# Patient Record
Sex: Female | Born: 1950
Health system: Southern US, Community
[De-identification: ages and names within clinical notes are randomized; demographics above are authoritative.]

## PROBLEM LIST (undated history)

## (undated) DIAGNOSIS — K449 Diaphragmatic hernia without obstruction or gangrene: Secondary | ICD-10-CM

## (undated) DIAGNOSIS — K589 Irritable bowel syndrome without diarrhea: Secondary | ICD-10-CM

## (undated) DIAGNOSIS — R011 Cardiac murmur, unspecified: Secondary | ICD-10-CM

## (undated) DIAGNOSIS — E785 Hyperlipidemia, unspecified: Secondary | ICD-10-CM

## (undated) DIAGNOSIS — N83209 Unspecified ovarian cyst, unspecified side: Secondary | ICD-10-CM

## (undated) DIAGNOSIS — I10 Essential (primary) hypertension: Secondary | ICD-10-CM

## (undated) HISTORY — PX: LAPAROSCOPY: SHX197

## (undated) HISTORY — DX: Essential (primary) hypertension: I10

## (undated) HISTORY — PX: DILATION AND CURETTAGE OF UTERUS: SHX78

## (undated) HISTORY — DX: Hyperlipidemia, unspecified: E78.5

---

## 1983-09-09 HISTORY — PX: TUBAL LIGATION: SHX77

## 1998-12-13 ENCOUNTER — Emergency Department (HOSPITAL_COMMUNITY): Admission: EM | Admit: 1998-12-13 | Discharge: 1998-12-13 | Payer: Self-pay | Admitting: Emergency Medicine

## 2000-07-22 ENCOUNTER — Inpatient Hospital Stay (HOSPITAL_COMMUNITY): Admission: AD | Admit: 2000-07-22 | Discharge: 2000-07-22 | Payer: Self-pay | Admitting: *Deleted

## 2002-01-28 ENCOUNTER — Encounter: Admission: RE | Admit: 2002-01-28 | Discharge: 2002-01-28 | Payer: Self-pay | Admitting: Emergency Medicine

## 2002-01-28 ENCOUNTER — Encounter: Payer: Self-pay | Admitting: Emergency Medicine

## 2003-06-09 ENCOUNTER — Emergency Department (HOSPITAL_COMMUNITY): Admission: EM | Admit: 2003-06-09 | Discharge: 2003-06-09 | Payer: Self-pay | Admitting: Emergency Medicine

## 2004-12-05 ENCOUNTER — Ambulatory Visit: Payer: Self-pay | Admitting: Family Medicine

## 2005-01-04 ENCOUNTER — Emergency Department (HOSPITAL_COMMUNITY): Admission: EM | Admit: 2005-01-04 | Discharge: 2005-01-05 | Payer: Self-pay | Admitting: Emergency Medicine

## 2005-05-15 ENCOUNTER — Emergency Department (HOSPITAL_COMMUNITY): Admission: EM | Admit: 2005-05-15 | Discharge: 2005-05-16 | Payer: Self-pay | Admitting: Emergency Medicine

## 2005-05-26 ENCOUNTER — Emergency Department (HOSPITAL_COMMUNITY): Admission: EM | Admit: 2005-05-26 | Discharge: 2005-05-27 | Payer: Self-pay | Admitting: *Deleted

## 2005-06-23 ENCOUNTER — Encounter: Admission: RE | Admit: 2005-06-23 | Discharge: 2005-06-23 | Payer: Self-pay | Admitting: Gastroenterology

## 2010-09-28 ENCOUNTER — Encounter: Payer: Self-pay | Admitting: Gastroenterology

## 2011-10-16 ENCOUNTER — Emergency Department (HOSPITAL_COMMUNITY): Payer: Self-pay

## 2011-10-16 ENCOUNTER — Emergency Department (HOSPITAL_COMMUNITY)
Admission: EM | Admit: 2011-10-16 | Discharge: 2011-10-17 | Disposition: A | Discharge status: Home / Self Care | Payer: Self-pay | Attending: Emergency Medicine | Admitting: Emergency Medicine

## 2011-10-16 ENCOUNTER — Encounter (HOSPITAL_COMMUNITY): Payer: Self-pay | Admitting: *Deleted

## 2011-10-16 DIAGNOSIS — F17200 Nicotine dependence, unspecified, uncomplicated: Secondary | ICD-10-CM | POA: Insufficient documentation

## 2011-10-16 DIAGNOSIS — R10819 Abdominal tenderness, unspecified site: Secondary | ICD-10-CM | POA: Insufficient documentation

## 2011-10-16 DIAGNOSIS — N949 Unspecified condition associated with female genital organs and menstrual cycle: Secondary | ICD-10-CM | POA: Insufficient documentation

## 2011-10-16 DIAGNOSIS — R350 Frequency of micturition: Secondary | ICD-10-CM | POA: Insufficient documentation

## 2011-10-16 DIAGNOSIS — N898 Other specified noninflammatory disorders of vagina: Secondary | ICD-10-CM | POA: Insufficient documentation

## 2011-10-16 DIAGNOSIS — N939 Abnormal uterine and vaginal bleeding, unspecified: Secondary | ICD-10-CM

## 2011-10-16 DIAGNOSIS — R109 Unspecified abdominal pain: Secondary | ICD-10-CM | POA: Insufficient documentation

## 2011-10-16 LAB — WET PREP, GENITAL: Yeast Wet Prep HPF POC: NONE SEEN

## 2011-10-16 LAB — URINALYSIS, ROUTINE W REFLEX MICROSCOPIC
Hgb urine dipstick: NEGATIVE
Specific Gravity, Urine: 1.014 (ref 1.005–1.030)
Urobilinogen, UA: 0.2 mg/dL (ref 0.0–1.0)
pH: 7 (ref 5.0–8.0)

## 2011-10-16 NOTE — ED Notes (Signed)
Pt states that she is post menopausal for 15-20 years and began having vaginal bleeding last night.  Pt also has been having some pain in her groin area and lower abdomen.  Pt states that the vaginal bleeding is lighter than her period used to be.

## 2011-10-16 NOTE — ED Provider Notes (Signed)
History     CSN: [id number]  Arrival date & time 10/16/11  1708   First MD Initiated Contact with Patient 10/16/11 1950      Chief Complaint  Patient presents with  . Vaginal Bleeding    (Consider location/radiation/quality/duration/timing/severity/associated sxs/prior treatment) Patient is a 61 y.o. female presenting with vaginal bleeding. The history is provided by the patient.  Vaginal Bleeding This is a new problem. The current episode started yesterday. The problem occurs constantly. The problem has been unchanged. Associated symptoms include abdominal pain. Pertinent negatives include no change in bowel habit, chills, fatigue, nausea, urinary symptoms or vomiting. The symptoms are aggravated by nothing. She has tried nothing for the symptoms.  Pt states she has not had a period in about 15 years. States has had lower abdominal cramping for last several days. States yesterday began having bleeding from vaginal canal. States bleeding similar to a period. Denies urinary symptoms. Denies rectal bleeding or blood in her urine. Denies fever, chills, nausea, vomiting.   History reviewed. No pertinent past medical history.  Past Surgical History  Procedure Date  . Tubal ligation     History reviewed. No pertinent family history.  History  Substance Use Topics  . Smoking status: Current Everyday Smoker -- 1.0 packs/day    Types: Cigarettes  . Smokeless tobacco: Not on file  . Alcohol Use: No    OB History    Grav Para Term Preterm Abortions TAB SAB Ect Mult Living                  Review of Systems  Constitutional: Negative for chills and fatigue.  HENT: Negative.   Eyes: Negative.   Respiratory: Negative.   Cardiovascular: Negative.   Gastrointestinal: Positive for abdominal pain. Negative for nausea, vomiting and change in bowel habit.  Genitourinary: Positive for frequency, vaginal bleeding and vaginal pain. Negative for dysuria, flank pain and difficulty urinating.    Musculoskeletal: Negative.   Skin: Negative.   Neurological: Negative.   Psychiatric/Behavioral: Negative.     Allergies  Penicillins  Home Medications   Current Outpatient Rx  Name Route Sig Dispense Refill  . ACETAMINOPHEN 500 MG PO TABS Oral Take 500 mg by mouth every 6 (six) hours as needed. For pain    . ASPIRIN 81 MG PO TABS Oral Take 81 mg by mouth daily.      BP 129/96  Pulse 114  Temp(Src) 100.4 F (38 C) (Oral)  Resp 22  SpO2 99%  Physical Exam  Nursing note and vitals reviewed. Constitutional: She is oriented to person, place, and time. She appears well-developed and well-nourished. No distress.  Eyes: Conjunctivae are normal.  Neck: Neck supple.  Cardiovascular: Normal rate, regular rhythm and normal heart sounds.   Pulmonary/Chest: Effort normal and breath sounds normal. No respiratory distress.  Abdominal: Soft. Bowel sounds are normal. She exhibits no distension. There is no rebound and no guarding.       Suprapubic, RLQ, LLQ tenderness  Genitourinary: Uterus is tender. Cervix exhibits motion tenderness. Right adnexum displays tenderness. Left adnexum displays tenderness. There is bleeding around the vagina.  Musculoskeletal: Normal range of motion.  Neurological: She is alert and oriented to person, place, and time.  Skin: Skin is warm.  Psychiatric: She has a normal mood and affect.    ED Course  Procedures (including critical care time)  Pt with post menopausal bleeding since last night. Moderate bleeding noted on pelvic exam. No clots. Uterine and bilateral adnexal tenderness. Will  get [place] to r/o fibroids/ endometritis.  Results for orders placed during the hospital encounter of 10/16/11  URINALYSIS, ROUTINE W REFLEX MICROSCOPIC      Component Value Range   Color, Urine YELLOW  YELLOW    APPearance CLEAR  CLEAR    Specific Gravity, Urine 1.014  1.005 - 1.030    pH 7.0  5.0 - 8.0    Glucose, UA NEGATIVE  NEGATIVE (mg/dL)   Hgb urine dipstick  NEGATIVE  NEGATIVE    Bilirubin Urine NEGATIVE  NEGATIVE    Ketones, ur NEGATIVE  NEGATIVE (mg/dL)   Protein, ur NEGATIVE  NEGATIVE (mg/dL)   Urobilinogen, UA 0.2  0.0 - 1.0 (mg/dL)   Nitrite NEGATIVE  NEGATIVE    Leukocytes, UA NEGATIVE  NEGATIVE   WET PREP, GENITAL      Component Value Range   Yeast Wet Prep HPF POC NONE SEEN  NONE SEEN    Trich, Wet Prep NONE SEEN  NONE SEEN    Clue Cells Wet Prep HPF POC FEW (*) NONE SEEN    WBC, Wet Prep HPF POC FEW (*) NONE SEEN    US Transvaginal Non-ob  10/16/2011  *RADIOLOGY REPORT*  Clinical Data: Bleeding`  TRANSABDOMINAL AND TRANSVAGINAL ULTRASOUND OF PELVIS Technique:  Both transabdominal and transvaginal ultrasound examinations of the pelvis were performed. Transabdominal technique was performed for global imaging of the pelvis including uterus, ovaries, adnexal regions, and pelvic cul-de-sac.  Comparison: None.   It was necessary to proceed with endovaginal exam following the transabdominal exam to better visualize the endometrium and ovaries.  Findings:  Uterus: 2.9 x 4.5 x 7.3 cm, unremarkable  Endometrium: 14 mm with a small amount of fluid in the endometrial cavity in the lower uterine segment.  Right ovary:  Not visualized  Left ovary: Not visualized  Other findings: No free fluid  IMPRESSION:  1.  Small amount of fluid in the endometrial cavity. 2.  Neither ovary is visualized on transabdominal or pelvic scanning.  Original Report Authenticated By: Osa Craver, M.D.   US Pelvis Complete  10/16/2011  *RADIOLOGY REPORT*  Clinical Data: Bleeding`  TRANSABDOMINAL AND TRANSVAGINAL ULTRASOUND OF PELVIS Technique:  Both transabdominal and transvaginal ultrasound examinations of the pelvis were performed. Transabdominal technique was performed for global imaging of the pelvis including uterus, ovaries, adnexal regions, and pelvic cul-de-sac.  Comparison: None.   It was necessary to proceed with endovaginal exam following the transabdominal exam  to better visualize the endometrium and ovaries.  Findings:  Uterus: 2.9 x 4.5 x 7.3 cm, unremarkable  Endometrium: 14 mm with a small amount of fluid in the endometrial cavity in the lower uterine segment.  Right ovary:  Not visualized  Left ovary: Not visualized  Other findings: No free fluid  IMPRESSION:  1.  Small amount of fluid in the endometrial cavity. 2.  Neither ovary is visualized on transabdominal or pelvic scanning.  Original Report Authenticated By: Osa Craver, M.D.    11:34 PM [place] negative. Ovaries not visualized. Pt's vs are normal, she is not tachycardic, not dizzy, bleeding just since yesterday, doubt H&H abnormal due to this bleeding. Did not check. Pt will need further evaluation and endometrial biopsy by GYN. Will refer for outpatient treatment.   No diagnosis found.    MDM          Lottie Mussel, PA 10/16/11 [phone]

## 2011-10-16 NOTE — ED Notes (Signed)
Pt returned from US

## 2011-10-16 NOTE — ED Notes (Signed)
Pt reports having menopause 20 years ago.  Started to have lower abdominal pain and groin pain x 4 days ago and bleeding that started yesterday.  Pt denies urinary symptoms.  Pt ambulatory.

## 2011-10-16 NOTE — ED Provider Notes (Signed)
Medical screening examination/treatment/procedure(s) were performed by non-physician practitioner and as supervising physician I was immediately available for consultation/collaboration. I have discussed the necessity for continued eval as an outpatient for this patient.  She was provided explicit instructions to facilitate this process  Gerhard Munch, MD 10/16/11 2345

## 2011-10-17 LAB — GC/CHLAMYDIA PROBE AMP, GENITAL
Chlamydia, DNA Probe: NEGATIVE
GC Probe Amp, Genital: NEGATIVE

## 2011-10-20 ENCOUNTER — Inpatient Hospital Stay (HOSPITAL_COMMUNITY)
Admission: AD | Admit: 2011-10-20 | Discharge: 2011-10-20 | Disposition: A | Discharge status: Home / Self Care | Payer: Self-pay | Source: Ambulatory Visit | Attending: Obstetrics & Gynecology | Admitting: Obstetrics & Gynecology

## 2011-10-20 ENCOUNTER — Encounter (HOSPITAL_COMMUNITY): Payer: Self-pay | Admitting: *Deleted

## 2011-10-20 DIAGNOSIS — N950 Postmenopausal bleeding: Secondary | ICD-10-CM

## 2011-10-20 HISTORY — DX: Diaphragmatic hernia without obstruction or gangrene: K44.9

## 2011-10-20 HISTORY — DX: Unspecified ovarian cyst, unspecified side: N83.209

## 2011-10-20 HISTORY — DX: Irritable bowel syndrome, unspecified: K58.9

## 2011-10-20 NOTE — ED Provider Notes (Signed)
61 y.o. [id number] seen in ED on 2/7 with postmenopausal bleeding. No bleeding or pain at this time. Had labs, pelvic and u/s at that visit. Was told to f/u in GYN clinic for EMB, came to [facility] by mistake tonight. No c/o now.   O:  Physical Exam  Nursing note and vitals reviewed. Constitutional: She is oriented to person, place, and time. She appears well-developed and well-nourished.  Cardiovascular: Normal rate.   Pulmonary/Chest: Effort normal.  Musculoskeletal: Normal range of motion.  Neurological: She is alert and oriented to person, place, and time.  Skin: Skin is warm and dry.  Psychiatric: She has a normal mood and affect.    US Transvaginal Non-ob  10/16/2011  *RADIOLOGY REPORT*  Clinical Data: Bleeding`  TRANSABDOMINAL AND TRANSVAGINAL ULTRASOUND OF PELVIS Technique:  Both transabdominal and transvaginal ultrasound examinations of the pelvis were performed. Transabdominal technique was performed for global imaging of the pelvis including uterus, ovaries, adnexal regions, and pelvic cul-de-sac.  Comparison: None.   It was necessary to proceed with endovaginal exam following the transabdominal exam to better visualize the endometrium and ovaries.  Findings:  Uterus: 2.9 x 4.5 x 7.3 cm, unremarkable  Endometrium: 14 mm with a small amount of fluid in the endometrial cavity in the lower uterine segment.  Right ovary:  Not visualized  Left ovary: Not visualized  Other findings: No free fluid  IMPRESSION:  1.  Small amount of fluid in the endometrial cavity. 2.  Neither ovary is visualized on transabdominal or pelvic scanning.  Original Report Authenticated By: Osa Craver, M.D.   US Pelvis Complete  10/16/2011  *RADIOLOGY REPORT*  Clinical Data: Bleeding`  TRANSABDOMINAL AND TRANSVAGINAL ULTRASOUND OF PELVIS Technique:  Both transabdominal and transvaginal ultrasound examinations of the pelvis were performed. Transabdominal technique was performed for global imaging of the pelvis including  uterus, ovaries, adnexal regions, and pelvic cul-de-sac.  Comparison: None.   It was necessary to proceed with endovaginal exam following the transabdominal exam to better visualize the endometrium and ovaries.  Findings:  Uterus: 2.9 x 4.5 x 7.3 cm, unremarkable  Endometrium: 14 mm with a small amount of fluid in the endometrial cavity in the lower uterine segment.  Right ovary:  Not visualized  Left ovary: Not visualized  Other findings: No free fluid  IMPRESSION:  1.  Small amount of fluid in the endometrial cavity. 2.  Neither ovary is visualized on transabdominal or pelvic scanning.  Original Report Authenticated By: Osa Craver, M.D.   A/P: 61 y.o. [id number] with postmenopausal bleeding Note sent to GYN clinic to scheduled f/u appt Rev'd precautions

## 2011-10-20 NOTE — Progress Notes (Signed)
Other day had some bleeding, vag.  Went to [facility], had [place] and 'pap'.  Was told to come here, that she needed a biopsy.  Is no longer having bleeding.  Had not had a period in over 22yrs.

## 2011-11-26 ENCOUNTER — Ambulatory Visit (INDEPENDENT_AMBULATORY_CARE_PROVIDER_SITE_OTHER): Payer: Self-pay | Admitting: Obstetrics & Gynecology

## 2011-11-26 ENCOUNTER — Encounter: Payer: Self-pay | Admitting: Obstetrics & Gynecology

## 2011-11-26 VITALS — BP 121/86 | HR 84 | Temp 97.1°F | Resp 20 | Ht 63.0 in | Wt 196.3 lb

## 2011-11-26 DIAGNOSIS — Z01419 Encounter for gynecological examination (general) (routine) without abnormal findings: Secondary | ICD-10-CM

## 2011-11-26 DIAGNOSIS — N950 Postmenopausal bleeding: Secondary | ICD-10-CM

## 2011-11-26 NOTE — Progress Notes (Signed)
History:  61 y.o. [id number] here today for fevaluation of postmenopausal bleeding.  She had one episode of bleeding on 10/20/11 and none since then.  She was had an ultrasound on 10/16/11, see report below.    10/16/11 TRANSABDOMINAL AND TRANSVAGINAL ULTRASOUND OF PELVIS  Findings: Uterus: 2.9 x 4.5 x 7.3 cm, unremarkable.  Endometrium: 14 mm with a small amount of fluid in the endometrial cavity in the lower uterine segment. Right ovary: Not visualized. Left ovary: Not visualized.  Other findings: No free fluid.  IMPRESSION: 1. Small amount of fluid in the endometrial cavity. 2. Neither ovary is visualized on transabdominal or pelvic scanning.   The following portions of the patient's history were reviewed and updated as appropriate: allergies, current medications, past family history, past medical history, past social history, past surgical history and problem list.   Objective:  Physical Exam Blood pressure 121/86, pulse 84, temperature 97.1 F (36.2 C), temperature source Oral, resp. rate 20, height 5\' 3"  (1.6 m), weight 196 lb 4.8 oz (89.041 kg). Gen: NAD Abd: Soft, nontender and nondistended Pelvic: Atrophic external genitalia, used Pedersen speculum with some difficulty; atrophic vaginal mucosa and cervix.  No discharge seen.  Cervix noted to be very friable and had significant bleeding when pap smear spatula was used, unable to pass endocervical brush/pipelle into cervix due to stenosis.  Unable to palpate uterus/adnexa secondary to habitus.  Assessment & Plan:  Postmenopausal bleeding, thickened endometrial stripe, cervical stenosis, bleeding on pap.  Pap likely inadequate secondary to cervical stenosis.    Given the concern about possible neoplasia, patient was counseled about needing hysteroscopy, D&C, possible repeat pap smear.  The risks of surgery were discussed in detail with the patient including but not limited to: bleeding; infection; injury to uterus or surrounding organs; need for  additional procedures including laparoscopy; and other postoperative or anesthesia complications.  All questions were answered.  She was told that she will be contacted by our surgical scheduler regarding the time and date of her surgery; routine preoperative instructions of having nothing to eat or drink after midnight on the day prior to surgery and also coming to the hospital 1 1/2 hours prior to her time of surgery were also emphasized.  She was told she may be called for a preoperative appointment about a week prior to surgery and will be given further preoperative instructions at that visit. Bleeding precautions reviewed.  Written information about the proposed procedures was given to the patient to review at home.

## 2011-11-26 NOTE — Patient Instructions (Signed)
Dilation and Curettage or Vacuum Curettage Dilation and curettage (D&C) and vacuum curettage are minor procedures. A D&C involves stretching (dilation) the cervix and scraping (curettage) the inside lining of the womb (uterus). During a D&C, tissue is gently scraped from the inside lining of the uterus. During a vacuum curettage, the lining and tissue in the uterus are removed with the use of gentle suction. Curettage may be performed for diagnostic or therapeutic purposes. As a diagnostic procedure, curettage is performed for the purpose of examining tissues from the uterus. Tissue examination may help determine causes or treatment options for symptoms. A diagnostic curettage may be performed for the following symptoms:  Irregular bleeding in the uterus.   Bleeding with the development of clots.   Spotting between menstrual periods.   Prolonged menstrual periods.   Bleeding after menopause.   No menstrual period (amenorrhea).   A change in size and shape of the uterus.  A therapeutic curettage is performed to remove tissue, blood, or a contraceptive device. Therapeutic curettage may be performed for the following conditions:   Removal of polyps inside the uterus.   Removal of uncommon types of fibroids (noncancerous lumps).  LET YOUR CAREGIVER KNOW ABOUT:   Allergies to food or medicine.   Medicines taken, including vitamins, herbs, eyedrops, over-the-counter medicines, and creams.   Use of steroids (by mouth or creams).   Previous problems with anesthetics or numbing medicines.   History of bleeding problems or blood clots.   Previous surgery.   Other health problems, including diabetes and kidney problems.   Possibility of pregnancy, if this applies.  RISKS AND COMPLICATIONS   Excessive bleeding.   Infection of the uterus.   Damage to the cervix.   Development of scar tissue (adhesions) inside the uterus, later causing abnormal amounts of menstrual bleeding.    Complications from the general anesthetic, if a general anesthetic is used.   Putting a hole (perforation) in the uterus. This is rare.  BEFORE THE PROCEDURE   Eat and drink before the procedure only as directed by your caregiver.   Arrange for someone to take you home.  PROCEDURE   This procedure may be done in a hospital, outpatient clinic, or caregiver's office.   You may be given a general anesthetic or a local anesthetic in and around the cervix.   You will lie on your back with your legs in stirrups.   There are two ways in which your cervix can be softened and dilated. These include:   Taking a medicine.   Having thin rods (laminaria) inserted into your cervix.   A curved tool (curette) will scrape cells from the inside lining of the uterus and will then be removed.  This procedure usually takes about 15 to 30 minutes. AFTER THE PROCEDURE   You will rest in the recovery area until you are stable and are ready to go home.   You will need to have someone take you home.   You may feel sick to your stomach (nauseous) or throw up (vomit) if you had general anesthesia.   You may have a sore throat if a tube was placed in your throat during general anesthesia.   You may have light cramping and bleeding for 2 days to 2 weeks after the procedure.   Your uterus needs to make a new lining after the procedure. This may make your next period late.  Document Released: 08/25/2005 Document Revised: 08/14/2011 Document Reviewed: 03/23/2009 [facility] Patient Information 2012 [place], [place].  Hysteroscopy Hysteroscopy is a procedure used for looking inside the womb (uterus). It may be done for many different reasons, including:  To evaluate abnormal bleeding, fibroid (benign, noncancerous) tumors, polyps, scar tissue (adhesions), and possibly cancer of the uterus.   To look for lumps (tumors) and other uterine growths.   To look for causes of why a woman cannot get pregnant  (infertility), causes of recurrent loss of pregnancy (miscarriages), or a lost intrauterine device (IUD).   To perform a sterilization by blocking the fallopian tubes from inside the uterus.  A hysteroscopy should be done right after a menstrual period to be sure you are not pregnant. LET YOUR CAREGIVER KNOW ABOUT:   Allergies.   Medicines taken, including herbs, eyedrops, over-the-counter medicines, and creams.   Use of steroids (by mouth or creams).   Previous problems with anesthetics or numbing medicines.   History of bleeding or blood problems.   History of blood clots.   Possibility of pregnancy, if this applies.   Previous surgery.   Other health problems.  RISKS AND COMPLICATIONS   Putting a hole in the uterus.   Excessive bleeding.   Infection.   Damage to the cervix.   Injury to other organs.   Allergic reaction to medicines.   Too much fluid used in the uterus for the procedure.  BEFORE THE PROCEDURE   Do not take aspirin or blood thinners for a week before the procedure, or as directed. It can cause bleeding.   Arrive at least 90 minutes before the procedure or as directed to read and sign the necessary forms.   Arrange for someone to take you home after the procedure.   If you smoke, do not smoke for 2 weeks before the procedure.  PROCEDURE   Your caregiver may give you medicine to relax you. He or she may also give you a medicine that numbs the area around the cervix (local anesthetic) or a medicine that makes you sleep (general anesthesia).   Sometimes, a medicine is placed in the cervix the day before the procedure. This medicine makes the cervix have a larger opening (dilate). This makes it easier for the instrument to be inserted into the uterus.   A small instrument (hysteroscope) is inserted through the vagina into the uterus. This instrument is similar to a pencil-sized telescope with a light.   During the procedure, air or a liquid is put  into the uterus, which allows the surgeon to see better.   Sometimes, tissue is gently scraped from inside the uterus. These tissue samples are sent to a specialist who looks at tissue samples (pathologist). The pathologist will give a report to your caregiver. This will help your caregiver decide if further treatment is necessary. The report will also help your caregiver decide on the best treatment if the test comes back abnormal.  AFTER THE PROCEDURE   If you had a general anesthetic, you may be groggy for a couple hours after the procedure.   If you had a local anesthetic, you will be advised to rest at the surgical center or caregiver's office until you are stable and feel ready to go home.   You may have some cramping for a couple days.   You may have bleeding, which varies from light spotting for a few days to menstrual-like bleeding for up to 3 to 7 days. This is normal.   Have someone take you home.  FINDING OUT THE RESULTS OF YOUR TEST Not all test  results are available during your visit. If your test results are not back during the visit, make an appointment with your caregiver to find out the results. Do not assume everything is normal if you have not heard from your caregiver or the medical facility. It is important for you to follow up on all of your test results. HOME CARE INSTRUCTIONS   Do not drive for 24 hours or as instructed.   Only take over-the-counter or prescription medicines for pain, discomfort, or fever as directed by your caregiver.   Do not take aspirin. It can cause or aggravate bleeding.   Do not drive or drink alcohol while taking pain medicine.   You may resume your usual diet.   Do not use tampons, douche, or have sexual intercourse for 2 weeks, or as advised by your caregiver.   Rest and sleep for the first 24 to 48 hours.   Take your temperature twice a day for 4 to 5 days. Write it down. Give these temperatures to your caregiver if they are  abnormal (above 98.6 F or 37.0 C).   Take medicines your caregiver has ordered as directed.   Follow your caregiver's advice regarding diet, exercise, lifting, driving, and general activities.   Take showers instead of baths for 2 weeks, or as recommended by your caregiver.   If you develop constipation:   Take a mild laxative with the advice of your caregiver.   Eat bran foods.   Drink enough water and fluids to keep your urine clear or pale yellow.   Try to have someone with you or available to you for the first 24 to 48 hours, especially if you had a general anesthetic.   Make sure you and your family understand everything about your operation and recovery.   Follow your caregiver's advice regarding follow-up appointments and Pap smears.  SEEK MEDICAL CARE IF:   You feel dizzy or lightheaded.   You feel sick to your stomach (nauseous).   You develop abnormal vaginal discharge.   You develop a rash.   You have an abnormal reaction or allergy to your medicine.   You need stronger pain medicine.  SEEK IMMEDIATE MEDICAL CARE IF:   Bleeding is heavier than a normal menstrual period or you have blood clots.   You have an oral temperature above 102 F (38.9 C), not controlled by medicine.   You have increasing cramps or pains not relieved with medicine.   You develop belly (abdominal) pain that does not seem to be related to the same area of earlier cramping and pain.   You pass out.   You develop pain in the tops of your shoulders (shoulder strap areas).   You develop shortness of breath.  MAKE SURE YOU:   Understand these instructions.   Will watch your condition.   Will get help right away if you are not doing well or get worse.  Document Released: 12/01/2000 Document Revised: 08/14/2011 Document Reviewed: 03/26/2009 [facility] Patient Information 2012 [place], [place].

## 2011-11-28 ENCOUNTER — Encounter (HOSPITAL_COMMUNITY): Payer: Self-pay | Admitting: Pharmacist

## 2011-11-28 ENCOUNTER — Other Ambulatory Visit: Payer: Self-pay | Admitting: Obstetrics & Gynecology

## 2011-11-28 DIAGNOSIS — Z1231 Encounter for screening mammogram for malignant neoplasm of breast: Secondary | ICD-10-CM

## 2011-12-08 ENCOUNTER — Encounter (HOSPITAL_COMMUNITY): Payer: Self-pay

## 2011-12-08 ENCOUNTER — Other Ambulatory Visit: Payer: Self-pay

## 2011-12-08 ENCOUNTER — Encounter (HOSPITAL_COMMUNITY)
Admission: RE | Admit: 2011-12-08 | Discharge: 2011-12-08 | Disposition: A | Discharge status: Home / Self Care | Payer: Self-pay | Source: Ambulatory Visit | Attending: Obstetrics & Gynecology | Admitting: Obstetrics & Gynecology

## 2011-12-08 HISTORY — DX: Cardiac murmur, unspecified: R01.1

## 2011-12-08 LAB — CBC
HCT: 40.6 % (ref 36.0–46.0)
Hemoglobin: 13.2 g/dL (ref 12.0–15.0)
MCH: 30.3 pg (ref 26.0–34.0)
MCHC: 32.5 g/dL (ref 30.0–36.0)
RDW: 14.4 % (ref 11.5–15.5)

## 2011-12-08 NOTE — Patient Instructions (Addendum)
   Your procedure is scheduled on: Wednesday April 10th  Enter through the [employer] of [facility] at:9:30am Pick up the phone at the desk and dial [phone] and inform us of your arrival.  Please call this number if you have any problems the morning of surgery: [phone]  Remember: Do not eat food after midnight: Tuesday Do not drink clear liquids after: midnight Tuesday Take these medicines the morning of surgery with a SIP OF WATER: none  Do not wear jewelry, make-up, or FINGER nail polish Do not wear lotions, powders, perfumes or deodorant. Do not shave 48 hours prior to surgery. Do not bring valuables to the hospital. Contacts, dentures or bridgework may not be worn into surgery.    Patients discharged on the day of surgery will not be allowed to drive home.     Remember to use your hibiclens as instructed.Please shower with 1/2 bottle the evening before your surgery and the other 1/2 bottle the morning of surgery. Neck down avoiding private area.

## 2011-12-17 ENCOUNTER — Ambulatory Visit (HOSPITAL_COMMUNITY)
Admission: RE | Admit: 2011-12-17 | Discharge: 2011-12-17 | Disposition: A | Discharge status: Home / Self Care | Payer: Self-pay | Source: Ambulatory Visit | Attending: Obstetrics & Gynecology | Admitting: Obstetrics & Gynecology

## 2011-12-17 ENCOUNTER — Encounter (HOSPITAL_COMMUNITY): Payer: Self-pay | Admitting: Anesthesiology

## 2011-12-17 ENCOUNTER — Encounter (HOSPITAL_COMMUNITY): Payer: Self-pay | Admitting: Obstetrics & Gynecology

## 2011-12-17 ENCOUNTER — Ambulatory Visit (HOSPITAL_COMMUNITY): Payer: Self-pay | Admitting: Anesthesiology

## 2011-12-17 ENCOUNTER — Encounter (HOSPITAL_COMMUNITY): Payer: Self-pay | Admitting: *Deleted

## 2011-12-17 ENCOUNTER — Encounter (HOSPITAL_COMMUNITY)
Admission: RE | Disposition: A | Discharge status: Home / Self Care | Payer: Self-pay | Source: Ambulatory Visit | Attending: Obstetrics & Gynecology

## 2011-12-17 DIAGNOSIS — Z01818 Encounter for other preprocedural examination: Secondary | ICD-10-CM | POA: Insufficient documentation

## 2011-12-17 DIAGNOSIS — Z01812 Encounter for preprocedural laboratory examination: Secondary | ICD-10-CM | POA: Insufficient documentation

## 2011-12-17 DIAGNOSIS — N950 Postmenopausal bleeding: Secondary | ICD-10-CM

## 2011-12-17 DIAGNOSIS — N882 Stricture and stenosis of cervix uteri: Secondary | ICD-10-CM

## 2011-12-17 HISTORY — PX: HYSTEROSCOPY WITH D & C: SHX1775

## 2011-12-17 SURGERY — DILATATION AND CURETTAGE /HYSTEROSCOPY
Anesthesia: General | Site: Vagina

## 2011-12-17 MED ORDER — KETOROLAC TROMETHAMINE 60 MG/2ML IM SOLN
INTRAMUSCULAR | Status: DC | PRN
  Administered 2011-12-17: 30 mg via INTRAMUSCULAR

## 2011-12-17 MED ORDER — KETOROLAC TROMETHAMINE 30 MG/ML IJ SOLN
INTRAMUSCULAR | Status: DC | PRN
  Administered 2011-12-17: 30 mg via INTRAVENOUS

## 2011-12-17 MED ORDER — GLYCOPYRROLATE 0.2 MG/ML IJ SOLN
INTRAMUSCULAR | Status: AC
  Filled 2011-12-17: qty 1

## 2011-12-17 MED ORDER — BUPIVACAINE HCL (PF) 0.25 % IJ SOLN
INTRAMUSCULAR | Status: DC | PRN
  Administered 2011-12-17: 30 mL

## 2011-12-17 MED ORDER — LACTATED RINGERS IV SOLN
INTRAVENOUS | Status: DC
  Administered 2011-12-17: 100 mL/h via INTRAVENOUS
  Administered 2011-12-17: 12:00:00 via INTRAVENOUS

## 2011-12-17 MED ORDER — DOCUSATE SODIUM 100 MG PO CAPS: 100.0000 mg | ORAL_CAPSULE | Freq: Two times a day (BID) | ORAL | Status: AC | PRN

## 2011-12-17 MED ORDER — KETOROLAC TROMETHAMINE 60 MG/2ML IM SOLN
INTRAMUSCULAR | Status: AC
  Filled 2011-12-17: qty 2

## 2011-12-17 MED ORDER — BUPIVACAINE HCL (PF) 0.25 % IJ SOLN
INTRAMUSCULAR | Status: AC
  Filled 2011-12-17: qty 30

## 2011-12-17 MED ORDER — FENTANYL CITRATE 0.05 MG/ML IJ SOLN: 25.0000 ug | INTRAMUSCULAR | Status: DC | PRN

## 2011-12-17 MED ORDER — FENTANYL CITRATE 0.05 MG/ML IJ SOLN
INTRAMUSCULAR | Status: AC
  Filled 2011-12-17: qty 2

## 2011-12-17 MED ORDER — MIDAZOLAM HCL 5 MG/5ML IJ SOLN
INTRAMUSCULAR | Status: DC | PRN
  Administered 2011-12-17: 2 mg via INTRAVENOUS

## 2011-12-17 MED ORDER — IBUPROFEN 200 MG PO TABS: 600.0000 mg | ORAL_TABLET | Freq: Four times a day (QID) | ORAL | Status: DC | PRN

## 2011-12-17 MED ORDER — LIDOCAINE HCL (CARDIAC) 20 MG/ML IV SOLN
INTRAVENOUS | Status: DC | PRN
  Administered 2011-12-17: 60 mg via INTRAVENOUS

## 2011-12-17 MED ORDER — ONDANSETRON HCL 4 MG/2ML IJ SOLN
INTRAMUSCULAR | Status: AC
  Filled 2011-12-17: qty 2

## 2011-12-17 MED ORDER — BUPIVACAINE-EPINEPHRINE (PF) 0.5% -1:200000 IJ SOLN
INTRAMUSCULAR | Status: AC
  Filled 2011-12-17: qty 10

## 2011-12-17 MED ORDER — KETOROLAC TROMETHAMINE 30 MG/ML IJ SOLN: 15.0000 mg | Freq: Once | INTRAMUSCULAR | Status: DC | PRN

## 2011-12-17 MED ORDER — MIDAZOLAM HCL 2 MG/2ML IJ SOLN
INTRAMUSCULAR | Status: AC
  Filled 2011-12-17: qty 2

## 2011-12-17 MED ORDER — OXYCODONE-ACETAMINOPHEN 5-325 MG PO TABS: 1.0000 | ORAL_TABLET | Freq: Four times a day (QID) | ORAL | Status: AC | PRN

## 2011-12-17 MED ORDER — PROPOFOL 10 MG/ML IV EMUL
INTRAVENOUS | Status: DC | PRN
  Administered 2011-12-17: 150 mg via INTRAVENOUS

## 2011-12-17 MED ORDER — DEXAMETHASONE SODIUM PHOSPHATE 10 MG/ML IJ SOLN
INTRAMUSCULAR | Status: AC
  Filled 2011-12-17: qty 1

## 2011-12-17 MED ORDER — FENTANYL CITRATE 0.05 MG/ML IJ SOLN
INTRAMUSCULAR | Status: DC | PRN
  Administered 2011-12-17 (×2): 50 ug via INTRAVENOUS

## 2011-12-17 MED ORDER — PROPOFOL 10 MG/ML IV EMUL
INTRAVENOUS | Status: AC
  Filled 2011-12-17: qty 20

## 2011-12-17 MED ORDER — LIDOCAINE HCL (CARDIAC) 20 MG/ML IV SOLN
INTRAVENOUS | Status: AC
  Filled 2011-12-17: qty 5

## 2011-12-17 SURGICAL SUPPLY — 15 items
CANISTER SUCTION 2500CC (MISCELLANEOUS) ×2 IMPLANT
CATH ROBINSON RED A/P 16FR (CATHETERS) ×2 IMPLANT
CLOTH BEACON ORANGE TIMEOUT ST (SAFETY) ×2 IMPLANT
CONTAINER PREFILL 10% NBF 60ML (FORM) ×4 IMPLANT
DILATOR CANAL MILEX (MISCELLANEOUS) ×2 IMPLANT
DRAPE PROXIMA HALF (DRAPES) ×2 IMPLANT
ELECT LOOP GYNE PRO 24FR (CUTTING LOOP)
ELECTRODE LOOP GYNE PRO 24FR (CUTTING LOOP) IMPLANT
GLOVE BIO SURGEON STRL SZ7 (GLOVE) ×2 IMPLANT
GOWN PREVENTION PLUS LG XLONG (DISPOSABLE) ×4 IMPLANT
GOWN STRL REIN XL XLG (GOWN DISPOSABLE) ×2 IMPLANT
LOOP ANGLED CUTTING 22FR (CUTTING LOOP) IMPLANT
PACK HYSTEROSCOPY LF (CUSTOM PROCEDURE TRAY) ×2 IMPLANT
TOWEL OR 17X24 6PK STRL BLUE (TOWEL DISPOSABLE) ×4 IMPLANT
WATER STERILE IRR 1000ML POUR (IV SOLUTION) ×2 IMPLANT

## 2011-12-17 NOTE — Anesthesia Preprocedure Evaluation (Addendum)
Anesthesia Evaluation  Patient identified by MRN, date of birth, ID band Patient awake    Reviewed: Allergy & Precautions, H&P , NPO status , Patient's Chart, lab work & pertinent test results, reviewed documented beta blocker date and time   History of Anesthesia Complications Negative for: history of anesthetic complications  Airway Mallampati: I TM Distance: >3 FB Neck ROM: full    Dental  (+) Poor Dentition,  Many missing or broken teeth, reports none are loose:   Pulmonary Current Smoker (1ppd),  breath sounds clear to auscultation  Pulmonary exam normal       Cardiovascular + Valvular Problems/Murmurs (has been told she had a heart murmur, I did not hear it) Rhythm:regular Rate:Normal  EKG from 12/08/11 is NSR, no evidence of ischemia   Neuro/Psych Patient reports episode 9-10 years ago where she was unable to speak. Evaluated at [facility], denies that she had stroke or TIA. negative psych ROS   GI/Hepatic Neg liver ROS, hiatal hernia (no meds),   Endo/Other  negative endocrine ROS  Renal/GU negative Renal ROS  negative genitourinary   Musculoskeletal negative musculoskeletal ROS (+)   Abdominal   Peds negative pediatric ROS (+)  Hematology negative hematology ROS (+)   Anesthesia Other Findings Poor historian.  Discussed risk of anesthesia related to smoking.  Reproductive/Obstetrics negative OB ROS                          Anesthesia Physical Anesthesia Plan  ASA: III  Anesthesia Plan: General LMA   Post-op Pain Management:    Induction:   Airway Management Planned:   Additional Equipment:   Intra-op Plan:   Post-operative Plan:   Informed Consent: I have reviewed the patients History and Physical, chart, labs and discussed the procedure including the risks, benefits and alternatives for the proposed anesthesia with the patient or authorized representative who has indicated  his/her understanding and acceptance.   Dental Advisory Given  Plan Discussed with: CRNA and Surgeon  Anesthesia Plan Comments:        Anesthesia Quick Evaluation

## 2011-12-17 NOTE — Anesthesia Procedure Notes (Signed)
Procedure Name: LMA Insertion Date/Time: 12/17/2011 11:59 AM Performed by: Graciela Husbands Pre-anesthesia Checklist: Patient identified, Timeout performed, Emergency Drugs available, Suction available and Patient being monitored Patient Re-evaluated:Patient Re-evaluated prior to inductionOxygen Delivery Method: Circle system utilized Preoxygenation: Pre-oxygenation with 100% oxygen Intubation Type: IV induction LMA: LMA inserted LMA Size: 4.0 Number of attempts: 1 Placement Confirmation: breath sounds checked- equal and bilateral and positive ETCO2 Dental Injury: Teeth and Oropharynx as per pre-operative assessment

## 2011-12-17 NOTE — H&P (View-Only) (Signed)
History:  61 y.o. Krista Jimenez here today for fevaluation of postmenopausal bleeding.  She had one episode of bleeding on 10/20/11 and none since then.  She was had an ultrasound on 10/16/11, see report below.    10/16/11 TRANSABDOMINAL AND TRANSVAGINAL ULTRASOUND OF PELVIS  Findings: Uterus: 2.9 x 4.5 x 7.3 cm, unremarkable.  Endometrium: 14 mm with a small amount of fluid in the endometrial cavity in the lower uterine segment. Right ovary: Not visualized. Left ovary: Not visualized.  Other findings: No free fluid.  IMPRESSION: 1. Small amount of fluid in the endometrial cavity. 2. Neither ovary is visualized on transabdominal or pelvic scanning.   The following portions of the patient's history were reviewed and updated as appropriate: allergies, current medications, past family history, past medical history, past social history, past surgical history and problem list.   Objective:  Physical Exam Blood pressure 121/86, pulse 84, temperature 97.1 F (36.2 C), temperature source Oral, resp. rate 20, height 5\' 3"  (1.6 m), weight 196 lb 4.8 oz (89.041 kg). Gen: NAD Abd: Soft, nontender and nondistended Pelvic: Atrophic external genitalia, used Pedersen speculum with some difficulty; atrophic vaginal mucosa and cervix.  No discharge seen.  Cervix noted to be very friable and had significant bleeding when pap smear spatula was used, unable to pass endocervical brush/pipelle into cervix due to stenosis.  Unable to palpate uterus/adnexa secondary to habitus.  Assessment & Plan:  Postmenopausal bleeding, thickened endometrial stripe, cervical stenosis, bleeding on pap.  Pap likely inadequate secondary to cervical stenosis.    Given the concern about possible neoplasia, patient was counseled about needing hysteroscopy, D&C, possible repeat pap smear.  The risks of surgery were discussed in detail with the patient including but not limited to: bleeding; infection; injury to uterus or surrounding organs; need for  additional procedures including laparoscopy; and other postoperative or anesthesia complications.  All questions were answered.  She was told that she will be contacted by our surgical scheduler regarding the time and date of her surgery; routine preoperative instructions of having nothing to eat or drink after midnight on the day prior to surgery and also coming to the hospital 1 1/2 hours prior to her time of surgery were also emphasized.  She was told she may be called for a preoperative appointment about a week prior to surgery and will be given further preoperative instructions at that visit. Bleeding precautions reviewed.  Written information about the proposed procedures was given to the patient to review at home.

## 2011-12-17 NOTE — Transfer of Care (Signed)
Immediate Anesthesia Transfer of Care Note  Patient: Krista Jimenez  Procedure(s) Performed: Procedure(s) (LRB): DILATATION AND CURETTAGE /HYSTEROSCOPY (N/A)  Patient Location: PACU  Anesthesia Type: General  Level of Consciousness: awake, alert  and oriented  Airway & Oxygen Therapy: Patient Spontanous Breathing and Patient connected to nasal cannula oxygen  Post-op Assessment: Report given to PACU RN and Post -op Vital signs reviewed and stable  Post vital signs: Reviewed and stable  Complications: No apparent anesthesia complications

## 2011-12-17 NOTE — Interval H&P Note (Signed)
History and Physical Interval Note: 12/17/2011 11:12 AM  Krista Jimenez  has presented today for surgery, with the diagnosis of Postmenopausal bleeding  The various methods of treatment have been discussed with the patient and family.  Questions were answered to the patient's satisfaction.  After consideration of risks, benefits and other options for treatment, the patient has consented to  Procedures: HYSTEROSCOPY,  DILATATION AND CURETTAGE as surgical intervention.  The patients' history has been reviewed, patient examined, no change in status, stable for surgery.  I have reviewed the patients' chart and labs; she had a normal pap smear in clinic which was satisfactory.  To OR when ready.  [name], M.D. 12/17/2011 11:14 AM

## 2011-12-17 NOTE — Discharge Instructions (Signed)
Hysteroscopy Hysteroscopy is a procedure used for looking inside the womb (uterus). It may be done for many different reasons, including:  To evaluate abnormal bleeding, fibroid (benign, noncancerous) tumors, polyps, scar tissue (adhesions), and possibly cancer of the uterus.   To look for lumps (tumors) and other uterine growths.   To look for causes of why a woman cannot get pregnant (infertility), causes of recurrent loss of pregnancy (miscarriages), or a lost intrauterine device (IUD).   To perform a sterilization by blocking the fallopian tubes from inside the uterus.  A hysteroscopy should be done right after a menstrual period to be sure you are not pregnant. LET YOUR CAREGIVER KNOW ABOUT:   Allergies.   Medicines taken, including herbs, eyedrops, over-the-counter medicines, and creams.   Use of steroids (by mouth or creams).   Previous problems with anesthetics or numbing medicines.   History of bleeding or blood problems.   History of blood clots.   Possibility of pregnancy, if this applies.   Previous surgery.   Other health problems.  RISKS AND COMPLICATIONS   Putting a hole in the uterus.   Excessive bleeding.   Infection.   Damage to the cervix.   Injury to other organs.   Allergic reaction to medicines.   Too much fluid used in the uterus for the procedure.  PROCEDURE   Your caregiver may give you medicine to relax you. He or she may also give you a medicine that numbs the area around the cervix (local anesthetic) or a medicine that makes you sleep (general anesthesia).   Sometimes, a medicine is placed in the cervix the day before the procedure. This medicine makes the cervix have a larger opening (dilate). This makes it easier for the instrument to be inserted into the uterus.   A small instrument (hysteroscope) is inserted through the vagina into the uterus. This instrument is similar to a pencil-sized telescope with a light.   During the  procedure, air or a liquid is put into the uterus, which allows the surgeon to see better.   Sometimes, tissue is gently scraped from inside the uterus. These tissue samples are sent to a specialist who looks at tissue samples (pathologist). The pathologist will give a report to your caregiver. This will help your caregiver decide if further treatment is necessary. The report will also help your caregiver decide on the best treatment if the test comes back abnormal.  AFTER THE PROCEDURE   If you had a general anesthetic, you may be groggy for a couple hours after the procedure.   If you had a local anesthetic, you will be advised to rest at the surgical center or caregiver's office until you are stable and feel ready to go home.   You may have some cramping for a couple days.   You may have bleeding, which varies from light spotting for a few days to menstrual-like bleeding for up to 3 to 7 days. This is normal.   Have someone take you home.  FINDING OUT THE RESULTS OF YOUR TEST Not all test results are available during your visit. If your test results are not back during the visit, make an appointment with your caregiver to find out the results. Do not assume everything is normal if you have not heard from your caregiver or the medical facility. It is important for you to follow up on all of your test results. HOME CARE INSTRUCTIONS   Do not drive for 24 hours or as instructed.  Only take over-the-counter or prescription medicines for pain, discomfort, or fever as directed by your caregiver.   Do not take aspirin. It can cause or aggravate bleeding.   Do not drive or drink alcohol while taking pain medicine.   You may resume your usual diet.   Do not use tampons, douche, or have sexual intercourse for 2 weeks, or as advised by your caregiver.   Rest and sleep for the first 24 to 48 hours.   Take medicines your caregiver has ordered as directed.   Follow your caregiver's advice  regarding diet, exercise, lifting, driving, and general activities.   Take showers instead of baths for 2 weeks, or as recommended by your caregiver.   If you develop constipation:   Take a mild laxative with the advice of your caregiver.   Eat bran foods.   Drink enough water and fluids to keep your urine clear or pale yellow.   Try to have someone with you or available to you for the first 24 to 48 hours, especially if you had a general anesthetic.   Make sure you and your family understand everything about your operation and recovery.   Follow your caregiver's advice regarding follow-up appointments and Pap smears.  SEEK MEDICAL CARE IF:   You feel dizzy or lightheaded.   You feel sick to your stomach (nauseous).   You develop abnormal vaginal discharge.   You develop a rash.   You have an abnormal reaction or allergy to your medicine.   You need stronger pain medicine.  SEEK IMMEDIATE MEDICAL CARE IF:   Bleeding is heavier than a normal menstrual period or you have blood clots.   You have an oral temperature above 102 F (38.9 C), not controlled by medicine.   You have increasing cramps or pains not relieved with medicine.   You develop belly (abdominal) pain that does not seem to be related to the same area of earlier cramping and pain.   You pass out.   You develop pain in the tops of your shoulders (shoulder strap areas).   You develop shortness of breath.  MAKE SURE YOU:   Understand these instructions.   Will watch your condition.   Will get help right away if you are not doing well or get worse.  Document Released: 12/01/2000 Document Revised: 08/14/2011 Document Reviewed: 03/26/2009 [facility] Patient Information 2012 [place], [place].

## 2011-12-17 NOTE — Anesthesia Postprocedure Evaluation (Signed)
  Anesthesia Post-op Note  Patient: Krista Jimenez  Procedure(s) Performed: Procedure(s) (LRB): DILATATION AND CURETTAGE /HYSTEROSCOPY (N/A)  Patient is awake and responsive. Pain and nausea are reasonably well controlled. Vital signs are stable and clinically acceptable. Oxygen saturation is clinically acceptable. There are no apparent anesthetic complications at this time. Patient is ready for discharge.

## 2011-12-17 NOTE — Op Note (Signed)
Krista Jimenez PROCEDURE DATE: 12/17/2011  PREOPERATIVE DIAGNOSIS:  Postmenopausal bleeding, cervical stenosis, unable to obtain endometrial biopsy in office. POSTOPERATIVE DIAGNOSIS: The same PROCEDURE: Hysteroscopy, Dilation and Curettage. SURGEON:  Dr. [name]   INDICATIONS: 61 y.o. [id number]  here for scheduled surgery for postmenopausal bleeding and cervical stenosis.   Risks of surgery were discussed with the patient including but not limited to: bleeding which may require transfusion; infection which may require antibiotics; injury to uterus or surrounding organs; intrauterine scarring; need for additional procedures including laparotomy or laparoscopy; and other postoperative/anesthesia complications. Written informed consent was obtained.    FINDINGS:  A 7 week size uterus.  Smooth, atrophic appearing endometrium; no lesions visualized.  I was only able to obtain scant endometrial curettings despite multiple attempts.  Normal ostia bilaterally. Cervical stenosis was alleviated using dilators.  ANESTHESIA:   General, paracervical block with 30 ml of 0.25% Marcaine. INTRAVENOUS FLUIDS:  1200 ml of LR FLUID DEFICITS:  40 ml of Glycine ESTIMATED BLOOD LOSS:  Less than 20 ml SPECIMENS: Endometrial curettings sent to pathology COMPLICATIONS:  None immediate.  PROCEDURE DETAILS:  The patient had SCDs placed on her lower extremities while in the preoperative area.  She was then taken to the operating room where general anesthesia was administered and was found to be adequate.  After an adequate timeout was performed, she was placed in the dorsal lithotomy position and examined; then prepped and draped in the sterile manner.   Her bladder was catheterized for an unmeasured amount of clear, yellow urine. A speculum was then placed in the patient's vagina and a single tooth tenaculum was applied to the anterior lip of the cervix.   A paracervical block using 30 ml of 0.25% Marcaine was  administered.  The cervix was dilated manually with metal dilators to accommodate the 5 mm diagnostic hysteroscope.  Once the cervix was dilated, the hysteroscope was inserted under direct visualization using glycine as a suspension medium.  The uterine cavity was carefully examined, both ostia were recognized, and smooth, atrophic endometrium was noted.  No lesions visualized.  After further careful visualization of the uterine cavity, the hysteroscope was removed under direct visualization.  A sharp curettage was then performed to obtain a scant amount of endometrial curettings.  The tenaculum was removed from the anterior lip of the cervix and the vaginal speculum was removed after noting good hemostasis.  The patient tolerated the procedure well and was taken to the recovery area awake, extubated and in stable condition.  The patient will be discharged to home as per PACU criteria.  Routine postoperative instructions given.  She was prescribed Percocet, Ibuprofen and Colace.  She will follow up in the clinic on 01/07/12 for postoperative evaluation.

## 2011-12-18 ENCOUNTER — Encounter (HOSPITAL_COMMUNITY): Payer: Self-pay | Admitting: Obstetrics & Gynecology

## 2011-12-22 ENCOUNTER — Telehealth: Payer: Self-pay | Admitting: *Deleted

## 2011-12-22 NOTE — Telephone Encounter (Signed)
Pt left message requesting call back. She has questions about returning to work.  I called and spoke w/pt who stated that she had returned to work today and became "woozy". Her co-workers told her that she "didn't look right" and encouraged her to go home. She is thinking that she had returned to work too soon. She has a job which requires her to stand and walk much of the [employer]. She would like to know the doctor's recommendations for when she should be able to return to work. She is still experiencing some abdominal pain and pelvic pressure, requiring percocet @ night.  She also states that her job may want a note to excuse her from work today and wants to know if we can provide it for her. I stated that I will send a message to her doctor. I will call her once I have had a response.  Pt voiced understanding.

## 2011-12-23 ENCOUNTER — Encounter: Payer: Self-pay | Admitting: *Deleted

## 2011-12-23 NOTE — Telephone Encounter (Signed)
Patient can be excused until 4/17, a week after minor surgery.  Please give her a note reflecting this.  Thank you.

## 2011-12-23 NOTE — Telephone Encounter (Signed)
Pt was notified that Dr. Macon Large can excuse her from work for 1 week after surgery. A letter will be prepared for her to give to her employer. Pt stated that she is not as weak today but is having a lot of sneezing and nasal congestion. I advised that she may have seasonal allergies. I recommended trying Claritin or Zyrtec and take according to package directions. Pt asked if she could be excused through the rest of the week since she has a mammogram appt tomorrow. I told her that I am not authorized to change the dates that the doctor has already given me and she will be excused for tomorrow. Pt voiced understanding and will pick up letter tomorrow when she is @ the hospital for scheduled mammogram.

## 2011-12-24 ENCOUNTER — Ambulatory Visit (HOSPITAL_COMMUNITY)
Admission: RE | Admit: 2011-12-24 | Discharge: 2011-12-24 | Disposition: A | Discharge status: Home / Self Care | Payer: Self-pay | Source: Ambulatory Visit | Attending: Obstetrics & Gynecology | Admitting: Obstetrics & Gynecology

## 2011-12-24 DIAGNOSIS — Z1231 Encounter for screening mammogram for malignant neoplasm of breast: Secondary | ICD-10-CM

## 2012-01-07 ENCOUNTER — Ambulatory Visit (INDEPENDENT_AMBULATORY_CARE_PROVIDER_SITE_OTHER): Payer: Self-pay | Admitting: Obstetrics & Gynecology

## 2012-01-07 ENCOUNTER — Encounter: Payer: Self-pay | Admitting: Obstetrics & Gynecology

## 2012-01-07 VITALS — BP 122/80 | HR 72 | Temp 98.5°F | Ht 63.0 in | Wt 200.1 lb

## 2012-01-07 DIAGNOSIS — Z09 Encounter for follow-up examination after completed treatment for conditions other than malignant neoplasm: Secondary | ICD-10-CM

## 2012-01-07 NOTE — Patient Instructions (Signed)
Return to clinic for any scheduled appointments or for any gynecologic concerns as needed.   

## 2012-01-07 NOTE — Progress Notes (Signed)
  Subjective:     Krista Jimenez is a 61 y.o. female who presents to the clinic 3 weeks status post D&C for postmenopausal bleeding and cervical stenosis. Eating a regular diet without difficulty. Bowel movements are normal. The patient is not having any pain. No further bleeding.  The following portions of the patient's history were reviewed and updated as appropriate: allergies, current medications, past family history, past medical history, past social history, past surgical history and problem list.  Review of Systems Pertinent items are noted in HPI.    Objective:    BP 122/80  Pulse 72  Temp 98.5 F (36.9 C)  Ht 5\' 3"  (1.6 m)  Wt 200 lb 1.6 oz (90.765 kg)  BMI 35.45 kg/m2 General:  alert and no distress  Abdomen: soft, bowel sounds active, non-tender  Pelvic:   No bleeding noted     12/17/11 Endometrium, curettage - SCANTY SUPERFICIAL FRAGMENTS OF ATROPHIC ENDOMETRIUM AND ABUNDANT BLOOD. - DETACHED FRAGMENTS OF SQUAMOUS EPITHELIUM WITH ASSOCIATED CHRONIC INFLAMMATION. PLEASE SEE COMMENT. Assessment:    Doing well postoperatively. Operative findings again reviewed. Pathology report discussed Postmenopausal most likely secondary to atrophy.    Plan:   1. Continue any current medications. 2. Bleeding precautions reviewed.  No further therapy for now.   3. Follow up as needed

## 2012-06-24 ENCOUNTER — Encounter (HOSPITAL_COMMUNITY): Payer: Self-pay | Admitting: Emergency Medicine

## 2012-06-24 ENCOUNTER — Emergency Department (INDEPENDENT_AMBULATORY_CARE_PROVIDER_SITE_OTHER)
Admission: EM | Admit: 2012-06-24 | Discharge: 2012-06-24 | Disposition: A | Discharge status: Home / Self Care | Payer: Self-pay | Source: Home / Self Care | Attending: Emergency Medicine | Admitting: Emergency Medicine

## 2012-06-24 DIAGNOSIS — A088 Other specified intestinal infections: Secondary | ICD-10-CM

## 2012-06-24 DIAGNOSIS — K589 Irritable bowel syndrome without diarrhea: Secondary | ICD-10-CM

## 2012-06-24 DIAGNOSIS — J069 Acute upper respiratory infection, unspecified: Secondary | ICD-10-CM

## 2012-06-24 DIAGNOSIS — A084 Viral intestinal infection, unspecified: Secondary | ICD-10-CM

## 2012-06-24 MED ORDER — ONDANSETRON HCL 4 MG PO TABS: 4.0000 mg | ORAL_TABLET | Freq: Four times a day (QID) | ORAL | Status: DC

## 2012-06-24 NOTE — ED Provider Notes (Signed)
History     CSN: [id number]  Arrival date & time 06/24/12  1542   First MD Initiated Contact with Patient 06/24/12 1547      Chief Complaint  Patient presents with  . URI    (Consider location/radiation/quality/duration/timing/severity/associated sxs/prior treatment) HPI Comments: 61 year old female presents with generalized headache associated with nausea vomiting and diarrhea for 2 days. She also complains of pain across her lower abdomen described as cramping. She's had this cramping for several years but was worse last February when she had to have a D&C. Or apparently associated with her recent GI symptoms. She thinks she may have had a fever but did not take it because she felt hot and cold. She says she has improved over the past 2 days she is now able to hold down fluids much better but she has continued to have a decrease in appetite. She also complains of malaise PND, cough, nasal congestion, but no sore throat or earache. He states that a primaryl reason she is here is to get a work note.   Past Medical History  Diagnosis Date  . Spastic colon   . Ovarian cyst   . Hiatal hernia   . Heart murmur     Past Surgical History  Procedure Date  . Laparoscopy     removal of ectopic preg  . Cesarean section 1979, 1985  . Tubal ligation 1985  . Dilation and curettage of uterus   . Hysteroscopy w/d&c 12/17/2011    Procedure: DILATATION AND CURETTAGE /HYSTEROSCOPY;  Surgeon: Tereso Newcomer, MD;  Location: [facility];  Service: Gynecology;  Laterality: N/A;    Family History  Problem Relation Age of Onset  . Anesthesia problems Neg Hx   . Hypertension Mother   . Aneurysm Mother   . Stroke Mother   . Hypertension Father   . Cancer Father     prostate  . Stroke Father   . Heart disease Sister     History  Substance Use Topics  . Smoking status: Current Every Day Smoker -- 1.0 packs/day for 39 years    Types: Cigarettes  . Smokeless tobacco: Never Used  . Alcohol Use:  Yes     occ beer    OB History    Grav Para Term Preterm Abortions TAB SAB Ect Mult Living   4 2 2  0 2 0 1 1 0 2      Review of Systems  Constitutional: Positive for appetite change and fatigue. Negative for fever, chills and activity change.  HENT: Positive for congestion, rhinorrhea and postnasal drip. Negative for ear pain, sore throat, facial swelling, mouth sores, trouble swallowing, neck pain and neck stiffness.   Eyes: Negative.   Respiratory: Negative.   Cardiovascular: Negative.   Gastrointestinal: Positive for nausea, abdominal pain and diarrhea. Negative for blood in stool, abdominal distention and rectal pain.  Genitourinary: Negative.   Skin: Negative for pallor and rash.  Neurological: Negative.     Allergies  Penicillins  Home Medications   Current Outpatient Rx  Name Route Sig Dispense Refill  . ACETAMINOPHEN 500 MG PO TABS Oral Take 500 mg by mouth every 6 (six) hours as needed. For pain    . DOCUSATE SODIUM 100 MG PO CAPS Oral Take 100 mg by mouth 2 (two) times daily.    . IBUPROFEN 200 MG PO TABS Oral Take 3 tablets (600 mg total) by mouth every 6 (six) hours as needed for pain. For pain 60 tablet 2  BP 144/81  Pulse 69  Temp 98.5 F (36.9 C) (Oral)  Resp 18  SpO2 98%  Physical Exam  Constitutional: She is oriented to person, place, and time. She appears well-developed and well-nourished. No distress.  HENT:  Right Ear: External ear normal.  Left Ear: External ear normal.  Nose: Nose normal.  Mouth/Throat: Oropharyngeal exudate present.  Eyes: Conjunctivae normal and EOM are normal. Pupils are equal, round, and reactive to light.  Neck: Normal range of motion. Neck supple.  Cardiovascular: Normal rate, regular rhythm and normal heart sounds.   Pulmonary/Chest: Effort normal and breath sounds normal. No respiratory distress. She has no wheezes.  Abdominal: Soft.       Mild generalized tenderness however most tenderness is across her lower  abdomen. This is the location of chronic intermittent and associated with spastic colon in her previous reason for the [facility]  Musculoskeletal: Normal range of motion. She exhibits no edema.  Lymphadenopathy:    She has no cervical adenopathy.  Neurological: She is alert and oriented to person, place, and time. No cranial nerve deficit.  Skin: Skin is warm and dry. No rash noted.  Psychiatric: She has a normal mood and affect.    ED Course  Procedures (including critical care time)  Labs Reviewed - No data to display No results found.   1. Viral gastroenteritis   2. Spastic colon   3. URI (upper respiratory infection)       MDM  Zofran 4 mg every 6 hours when necessary liquids and gradually introduce solid foods as directed by the instruction sheet. Note To work Monday.        Hayden Rasmussen, NP 06/24/12 1754

## 2012-06-24 NOTE — ED Notes (Signed)
Pt c/o cold/flu like symptoms x 2 days  Pt states that she has a productive cough with green sputum, body aches,hot and cold chills, runny nose with head stuffiness.  Pt has felt feverish but did not check temperature. Pt has had n/v/d with abdominal pain.  Pt has been in contact with sick grandchild with similar symptoms.

## 2012-06-25 NOTE — ED Provider Notes (Signed)
Medical screening examination/treatment/procedure(s) were performed by non-physician practitioner and as supervising physician I was immediately available for consultation/collaboration.  Leslee Home, M.D.   Reuben Likes, MD 06/25/12 [phone]

## 2014-04-13 ENCOUNTER — Encounter (HOSPITAL_COMMUNITY): Payer: Self-pay | Admitting: Emergency Medicine

## 2014-04-13 DIAGNOSIS — M5137 Other intervertebral disc degeneration, lumbosacral region: Secondary | ICD-10-CM | POA: Insufficient documentation

## 2014-04-13 DIAGNOSIS — F17200 Nicotine dependence, unspecified, uncomplicated: Secondary | ICD-10-CM | POA: Diagnosis not present

## 2014-04-13 DIAGNOSIS — Z880 Allergy status to penicillin: Secondary | ICD-10-CM | POA: Insufficient documentation

## 2014-04-13 DIAGNOSIS — M545 Low back pain, unspecified: Secondary | ICD-10-CM | POA: Diagnosis present

## 2014-04-13 DIAGNOSIS — R262 Difficulty in walking, not elsewhere classified: Secondary | ICD-10-CM | POA: Insufficient documentation

## 2014-04-13 DIAGNOSIS — R011 Cardiac murmur, unspecified: Secondary | ICD-10-CM | POA: Insufficient documentation

## 2014-04-13 DIAGNOSIS — Z79899 Other long term (current) drug therapy: Secondary | ICD-10-CM | POA: Diagnosis not present

## 2014-04-13 DIAGNOSIS — Z8742 Personal history of other diseases of the female genital tract: Secondary | ICD-10-CM | POA: Diagnosis not present

## 2014-04-13 DIAGNOSIS — M7989 Other specified soft tissue disorders: Secondary | ICD-10-CM | POA: Diagnosis not present

## 2014-04-13 DIAGNOSIS — Z791 Long term (current) use of non-steroidal anti-inflammatories (NSAID): Secondary | ICD-10-CM | POA: Diagnosis not present

## 2014-04-13 DIAGNOSIS — M51379 Other intervertebral disc degeneration, lumbosacral region without mention of lumbar back pain or lower extremity pain: Secondary | ICD-10-CM | POA: Insufficient documentation

## 2014-04-13 DIAGNOSIS — Z8719 Personal history of other diseases of the digestive system: Secondary | ICD-10-CM | POA: Diagnosis not present

## 2014-04-13 NOTE — ED Notes (Signed)
Patient here from home with numerous complaints. States that her upper back/lower head, lower back, and legs hurt. Explains that she has had regular swelling of her lower extremities for "a while". Currently left foot appears more swollen than right. Denies difficulty breathing while laying, shortness of breath, and chest pain. Additionally states that she has been having trouble "holding her urine" for about 6 months. Presents tonight primarily because she is having great difficulty ambulating.

## 2014-04-14 ENCOUNTER — Emergency Department (HOSPITAL_COMMUNITY)
Admission: EM | Admit: 2014-04-14 | Discharge: 2014-04-14 | Disposition: A | Discharge status: Home / Self Care | Payer: Medicare HMO | Attending: Emergency Medicine | Admitting: Emergency Medicine

## 2014-04-14 ENCOUNTER — Emergency Department (HOSPITAL_COMMUNITY): Payer: Medicare HMO

## 2014-04-14 ENCOUNTER — Telehealth (HOSPITAL_BASED_OUTPATIENT_CLINIC_OR_DEPARTMENT_OTHER): Payer: Self-pay | Admitting: Emergency Medicine

## 2014-04-14 DIAGNOSIS — M5136 Other intervertebral disc degeneration, lumbar region: Secondary | ICD-10-CM

## 2014-04-14 DIAGNOSIS — M545 Low back pain, unspecified: Secondary | ICD-10-CM

## 2014-04-14 LAB — CBC
HEMATOCRIT: 42.7 % (ref 36.0–46.0)
HEMOGLOBIN: 14.2 g/dL (ref 12.0–15.0)
MCH: 31.1 pg (ref 26.0–34.0)
MCHC: 33.3 g/dL (ref 30.0–36.0)
MCV: 93.4 fL (ref 78.0–100.0)
Platelets: 242 10*3/uL (ref 150–400)
RBC: 4.57 MIL/uL (ref 3.87–5.11)
RDW: 14.5 % (ref 11.5–15.5)
WBC: 8.4 10*3/uL (ref 4.0–10.5)

## 2014-04-14 LAB — COMPREHENSIVE METABOLIC PANEL
ALT: 13 U/L (ref 0–35)
AST: 19 U/L (ref 0–37)
Albumin: 3.8 g/dL (ref 3.5–5.2)
Alkaline Phosphatase: 65 U/L (ref 39–117)
Anion gap: 12 (ref 5–15)
BUN: 13 mg/dL (ref 6–23)
CO2: 25 mEq/L (ref 19–32)
Calcium: 9.7 mg/dL (ref 8.4–10.5)
Chloride: 102 mEq/L (ref 96–112)
Creatinine, Ser: 0.72 mg/dL (ref 0.50–1.10)
GFR calc Af Amer: >90 mL/min (ref >90)
GFR calc non Af Amer: 88 mL/min — ABNORMAL LOW (ref >90)
GLUCOSE: 96 mg/dL (ref 70–99)
POTASSIUM: 4.3 meq/L (ref 3.7–5.3)
SODIUM: 139 meq/L (ref 137–147)
TOTAL PROTEIN: 7.5 g/dL (ref 6.0–8.3)
Total Bilirubin: 0.2 mg/dL — ABNORMAL LOW (ref 0.3–1.2)

## 2014-04-14 LAB — URINALYSIS, ROUTINE W REFLEX MICROSCOPIC
Bilirubin Urine: NEGATIVE
Glucose, UA: NEGATIVE mg/dL
Ketones, ur: NEGATIVE mg/dL
Nitrite: NEGATIVE
Protein, ur: NEGATIVE mg/dL
SPECIFIC GRAVITY, URINE: 1.017 (ref 1.005–1.030)
UROBILINOGEN UA: 1 mg/dL (ref 0.0–1.0)
pH: 6 (ref 5.0–8.0)

## 2014-04-14 LAB — URINE MICROSCOPIC-ADD ON

## 2014-04-14 MED ORDER — HYDROCODONE-ACETAMINOPHEN 5-325 MG PO TABS
1.0000 | ORAL_TABLET | Freq: Four times a day (QID) | ORAL | Status: DC | PRN
Start: 2014-04-14 — End: 2020-04-23

## 2014-04-14 MED ORDER — TRAMADOL HCL 50 MG PO TABS
50.0000 mg | ORAL_TABLET | Freq: Once | ORAL | Status: AC
  Administered 2014-04-14: 50 mg via ORAL
  Filled 2014-04-14: qty 1

## 2014-04-14 MED ORDER — TOBRAMYCIN 0.3 % OP SOLN
1.0000 [drp] | Freq: Once | OPHTHALMIC | Status: AC
  Administered 2014-04-14: 1 [drp] via OPHTHALMIC
  Filled 2014-04-14: qty 5

## 2014-04-14 NOTE — ED Notes (Signed)
Pt at x-ray.  This RN introduced self to family at bedside.

## 2014-04-14 NOTE — Discharge Instructions (Signed)
Take motrin or aleve as need for pain. You may also take hydrocodone as need for pain. No driving for the next 6 hours or when taking hydrocodone. Also, do not take tylenol or acetaminophen containing medication when taking hydrocodone. Follow up with primary care doctor in the next few days for recheck.  For eye, use tobrex drops 1-2 drops in left eye 4x/day. Avoid rubbing eyes, wash hands well if rubbing face/eye area.   Return to ER if worse, new symptoms, fevers, leg numbness/weakness, other concern.   Back Pain, Adult Low back pain is very common. About 1 in 5 people have back pain.The cause of low back pain is rarely dangerous. The pain often gets better over time.About half of people with a sudden onset of back pain feel better in just 2 weeks. About 8 in 10 people feel better by 6 weeks.  CAUSES Some common causes of back pain include:  Strain of the muscles or ligaments supporting the spine.  Wear and tear (degeneration) of the spinal discs.  Arthritis.  Direct injury to the back. DIAGNOSIS Most of the time, the direct cause of low back pain is not known.However, back pain can be treated effectively even when the exact cause of the pain is unknown.Answering your caregiver's questions about your overall health and symptoms is one of the most accurate ways to make sure the cause of your pain is not dangerous. If your caregiver needs more information, he or she may order lab work or imaging tests (X-rays or MRIs).However, even if imaging tests show changes in your back, this usually does not require surgery. HOME CARE INSTRUCTIONS For many people, back pain returns.Since low back pain is rarely dangerous, it is often a condition that people can learn to [facility] their own.   Remain active. It is stressful on the back to sit or stand in one place. Do not sit, drive, or stand in one place for more than 30 minutes at a time. Take short walks on level surfaces as soon as pain  allows.Try to increase the length of time you walk each day.  Do not stay in bed.Resting more than 1 or 2 days can delay your recovery.  Do not avoid exercise or work.Your body is made to move.It is not dangerous to be active, even though your back may hurt.Your back will likely heal faster if you return to being active before your pain is gone.  Pay attention to your body when you bend and lift. Many people have less discomfortwhen lifting if they bend their knees, keep the load close to their bodies,and avoid twisting. Often, the most comfortable positions are those that put less stress on your recovering back.  Find a comfortable position to sleep. Use a firm mattress and lie on your side with your knees slightly bent. If you lie on your back, put a pillow under your knees.  Only take over-the-counter or prescription medicines as directed by your caregiver. Over-the-counter medicines to reduce pain and inflammation are often the most helpful.Your caregiver may prescribe muscle relaxant drugs.These medicines help dull your pain so you can more quickly return to your normal activities and healthy exercise.  Put ice on the injured area.  Put ice in a plastic bag.  Place a towel between your skin and the bag.  Leave the ice on for 15-20 minutes, 03-04 times a day for the first 2 to 3 days. After that, ice and heat may be alternated to reduce pain and spasms.  Ask your caregiver about trying back exercises and gentle massage. This may be of some benefit.  Avoid feeling anxious or stressed.Stress increases muscle tension and can worsen back pain.It is important to recognize when you are anxious or stressed and learn ways to manage it.Exercise is a great option. SEEK MEDICAL CARE IF:  You have pain that is not relieved with rest or medicine.  You have pain that does not improve in 1 week.  You have new symptoms.  You are generally not feeling well. SEEK IMMEDIATE MEDICAL CARE  IF:   You have pain that radiates from your back into your legs.  You develop new bowel or bladder control problems.  You have unusual weakness or numbness in your arms or legs.  You develop nausea or vomiting.  You develop abdominal pain.  You feel faint. Document Released: 08/25/2005 Document Revised: 02/24/2012 Document Reviewed: 12/27/2013 [facility] Patient Information 2015 [place], [place]. This information is not intended to replace advice given to you by your health care provider. Make sure you discuss any questions you have with your health care provider.   Degenerative Disk Disease Degenerative disk disease is a condition caused by the changes that occur in the cushions of the backbone (spinal disks) as you grow older. Spinal disks are soft and compressible disks located between the bones of the spine (vertebrae). They act like shock absorbers. Degenerative disk disease can affect the whole spine. However, the neck and lower back are most commonly affected. Many changes can occur in the spinal disks with aging, such as:  The spinal disks may dry and shrink.  Small tears may occur in the tough, outer covering of the disk (annulus).  The disk space may become smaller due to loss of water.  Abnormal growths in the bone (spurs) may occur. This can put pressure on the nerve roots exiting the spinal canal, causing pain.  The spinal canal may become narrowed. CAUSES  Degenerative disk disease is a condition caused by the changes that occur in the spinal disks with aging. The exact cause is not known, but there is a genetic basis for many patients. Degenerative changes can occur due to loss of fluid in the disk. This makes the disk thinner and reduces the space between the backbones. Small cracks can develop in the outer layer of the disk. This can lead to the breakdown of the disk. You are more likely to get degenerative disk disease if you are overweight. Smoking cigarettes and doing  heavy work such as weightlifting can also increase your risk of this condition. Degenerative changes can start after a sudden injury. Growth of bone spurs can compress the nerve roots and cause pain.  SYMPTOMS  The symptoms vary from person to person. Some people may have no pain, while others have severe pain. The pain may be so severe that it can limit your activities. The location of the pain depends on the part of your backbone that is affected. You will have neck or arm pain if a disk in the neck area is affected. You will have pain in your back, buttocks, or legs if a disk in the lower back is affected. The pain becomes worse while bending, reaching up, or with twisting movements. The pain may start gradually and then get worse as time passes. It may also start after a major or minor injury. You may feel numbness or tingling in the arms or legs.  DIAGNOSIS  Your caregiver will ask you about your symptoms  and about activities or habits that may cause the pain. He or she may also ask about any injuries, diseases, or treatments you have had earlier. Your caregiver will examine you to check for the range of movement that is possible in the affected area, to check for strength in your extremities, and to check for sensation in the areas of the arms and legs supplied by different nerve roots. An X-ray of the spine may be taken. Your caregiver may suggest other imaging tests, such as magnetic resonance imaging (MRI), if needed.  TREATMENT  Treatment includes rest, modifying your activities, and applying ice and heat. Your caregiver may prescribe medicines to reduce your pain and may ask you to do some exercises to strengthen your back. In some cases, you may need surgery. You and your caregiver will decide on the treatment that is best for you. HOME CARE INSTRUCTIONS   Follow proper lifting and walking techniques as advised by your caregiver.  Maintain good posture.  Exercise regularly as  advised.  Perform relaxation exercises.  Change your sitting, standing, and sleeping habits as advised. Change positions frequently.  Lose weight as advised.  Stop smoking if you smoke.  Wear supportive footwear. SEEK MEDICAL CARE IF:  Your pain does not go away within 1 to 4 weeks. SEEK IMMEDIATE MEDICAL CARE IF:   Your pain is severe.  You notice weakness in your arms, hands, or legs.  You begin to lose control of your bladder or bowel movements. MAKE SURE YOU:   Understand these instructions.  Will watch your condition.  Will get help right away if you are not doing well or get worse. Document Released: 06/22/2007 Document Revised: 11/17/2011 Document Reviewed: 12/27/2013 [facility] Patient Information 2015 [place], [place]. This information is not intended to replace advice given to you by your health care provider. Make sure you discuss any questions you have with your health care provider.   Conjunctivitis Conjunctivitis is commonly called "pink eye." Conjunctivitis can be caused by bacterial or viral infection, allergies, or injuries. There is usually redness of the lining of the eye, itching, discomfort, and sometimes discharge. There may be deposits of matter along the eyelids. A viral infection usually causes a watery discharge, while a bacterial infection causes a yellowish, thick discharge. Pink eye is very contagious and spreads by direct contact. You may be given antibiotic eyedrops as part of your treatment. Before using your eye medicine, remove all drainage from the eye by washing gently with warm water and cotton balls. Continue to use the medication until you have awakened 2 mornings in a row without discharge from the eye. Do not rub your eye. This increases the irritation and helps spread infection. Use separate towels from other household members. Wash your hands with soap and water before and after touching your eyes. Use cold compresses to reduce pain and  sunglasses to relieve irritation from light. Do not wear contact lenses or wear eye makeup until the infection is gone. SEEK MEDICAL CARE IF:   Your symptoms are not better after 3 days of treatment.  You have increased pain or trouble seeing.  The outer eyelids become very red or swollen. Document Released: 10/02/2004 Document Revised: 11/17/2011 Document Reviewed: 08/25/2005 [facility] Patient Information 2015 [place], [place]. This information is not intended to replace advice given to you by your health care provider. Make sure you discuss any questions you have with your health care provider.

## 2014-04-14 NOTE — ED Notes (Signed)
Pt alert and oriented at discharge.  Pt advised on prescriptions and use of eye drops.  Pt offered a wheelchair at discharge but refused.  Pt escorted to the waiting room by this RN.

## 2014-04-14 NOTE — ED Provider Notes (Addendum)
CSN: [id number]     Arrival date & time 04/13/14  2254 History   First MD Initiated Contact with Patient 04/14/14 0154     Chief Complaint  Patient presents with  . Back Pain  . Leg Swelling  . Difficulty Walking     (Consider location/radiation/quality/duration/timing/severity/associated sxs/prior Treatment) Patient is a 63 y.o. female presenting with back pain. The history is provided by the patient.  Back Pain Associated symptoms: no abdominal pain, no chest pain, no fever, no headaches, no numbness and no weakness   pt c/o low back pain for the past week, also states her arthritis is acting up.  C/o muscle spasms in back and legs. Pain primarily in low back, dull, moderate-severe, worse w certain movements and positional changes.  No radicular pain. No numbness/weakness. +urinary urgency. No dysuria. No fever or chills. Denies trauma or fall. States similar pain for 'long while' due to arthritis, but worse in past week.      Past Medical History  Diagnosis Date  . Spastic colon   . Ovarian cyst   . Hiatal hernia   . Heart murmur    Past Surgical History  Procedure Laterality Date  . Laparoscopy      removal of ectopic preg  . Cesarean section  1979, 1985  . Tubal ligation  1985  . Dilation and curettage of uterus    . Hysteroscopy w/d&c  12/17/2011    Procedure: DILATATION AND CURETTAGE /HYSTEROSCOPY;  Surgeon: Osborne Oman, MD;  Location: [facility];  Service: Gynecology;  Laterality: N/A;   Family History  Problem Relation Age of Onset  . Anesthesia problems Neg Hx   . Hypertension Mother   . Aneurysm Mother   . Stroke Mother   . Hypertension Father   . Cancer Father     prostate  . Stroke Father   . Heart disease Sister    History  Substance Use Topics  . Smoking status: Current Every Day Smoker -- 1.00 packs/day for 39 years    Types: Cigarettes  . Smokeless tobacco: Never Used  . Alcohol Use: Yes     Comment: occ beer   OB History   Grav Para Term  Preterm Abortions TAB SAB Ect Mult Living   4 2 2  0 2 0 1 1 0 2     Review of Systems  Constitutional: Negative for fever and chills.  HENT: Negative for sore throat.   Eyes: Positive for discharge, redness and itching.  Respiratory: Negative for cough and shortness of breath.   Cardiovascular: Negative for chest pain.  Gastrointestinal: Negative for vomiting, abdominal pain and diarrhea.  Genitourinary: Negative for flank pain.  Musculoskeletal: Positive for back pain. Negative for neck pain.  Skin: Negative for rash.  Neurological: Negative for weakness, numbness and headaches.  Hematological: Does not bruise/bleed easily.  Psychiatric/Behavioral: Negative for confusion.      Allergies  Penicillins  Home Medications   Prior to Admission medications   Medication Sig Start Date End Date Taking? Authorizing Provider  acetaminophen (TYLENOL) 500 MG tablet Take 500 mg by mouth every 6 (six) hours as needed. For pain    Historical Provider, MD  docusate sodium (COLACE) 100 MG capsule Take 100 mg by mouth 2 (two) times daily.    Historical Provider, MD  ibuprofen (ADVIL,MOTRIN) 200 MG tablet Take 3 tablets (600 mg total) by mouth every 6 (six) hours as needed for pain. For pain 12/17/11   Osborne Oman, MD  ondansetron [facility])  4 MG tablet Take 1 tablet (4 mg total) by mouth every 6 (six) hours. 06/24/12   Janne Napoleon, NP   BP 151/74  Pulse 75  Temp(Src) 97.7 F (36.5 C) (Oral)  Resp 16  SpO2 98% Physical Exam  Nursing note and vitals reviewed. Constitutional: She is oriented to person, place, and time. She appears well-developed and well-nourished. No distress.  HENT:  Head: Atraumatic.  Mouth/Throat: Oropharynx is clear and moist.  Eyes: Left eye exhibits discharge. No scleral icterus.  Left eye conjunctivitis.   Neck: Neck supple. No tracheal deviation present.  Cardiovascular: Normal rate, regular rhythm, normal heart sounds and intact distal pulses.    Pulmonary/Chest: Effort normal and breath sounds normal. No respiratory distress.  Abdominal: Soft. Normal appearance and bowel sounds are normal. She exhibits no distension and no mass. There is no tenderness. There is no rebound and no guarding.  Genitourinary:  No cva tenderness  Musculoskeletal: She exhibits no edema and no tenderness.  Lumbar tenderness, otherwise, CTLS spine, non tender, aligned, no step off. Good rom bil ext, no pain or focal bony tenderness.   Neurological: She is alert and oriented to person, place, and time. She displays normal reflexes.  Motor intact bil legs, st 5/5, sens intact.  Skin: Skin is warm and dry. No rash noted. She is not diaphoretic.  Psychiatric: She has a normal mood and affect.    ED Course  Procedures (including critical care time) Labs Review  Results for orders placed during the hospital encounter of 04/14/14  CBC      Result Value Ref Range   WBC 8.4  4.0 - 10.5 K/uL   RBC 4.57  3.87 - 5.11 MIL/uL   Hemoglobin 14.2  12.0 - 15.0 g/dL   HCT 42.7  36.0 - 46.0 %   MCV 93.4  78.0 - 100.0 fL   MCH 31.1  26.0 - 34.0 pg   MCHC 33.3  30.0 - 36.0 g/dL   RDW 14.5  11.5 - 15.5 %   Platelets 242  150 - 400 K/uL  COMPREHENSIVE METABOLIC PANEL      Result Value Ref Range   Sodium 139  137 - 147 mEq/L   Potassium 4.3  3.7 - 5.3 mEq/L   Chloride 102  96 - 112 mEq/L   CO2 25  19 - 32 mEq/L   Glucose, Bld 96  70 - 99 mg/dL   BUN 13  6 - 23 mg/dL   Creatinine, Ser 0.72  0.50 - 1.10 mg/dL   Calcium 9.7  8.4 - 10.5 mg/dL   Total Protein 7.5  6.0 - 8.3 g/dL   Albumin 3.8  3.5 - 5.2 g/dL   AST 19  0 - 37 U/L   ALT 13  0 - 35 U/L   Alkaline Phosphatase 65  39 - 117 U/L   Total Bilirubin 0.2 (*) 0.3 - 1.2 mg/dL   GFR calc non Af Amer 88 (*) >90 mL/min   GFR calc Af Amer >90  >90 mL/min   Anion gap 12  5 - 15  URINALYSIS, ROUTINE W REFLEX MICROSCOPIC      Result Value Ref Range   Color, Urine YELLOW  YELLOW   APPearance CLEAR  CLEAR    Specific Gravity, Urine 1.017  1.005 - 1.030   pH 6.0  5.0 - 8.0   Glucose, UA NEGATIVE  NEGATIVE mg/dL   Hgb urine dipstick TRACE (*) NEGATIVE   Bilirubin Urine NEGATIVE  NEGATIVE  Ketones, ur NEGATIVE  NEGATIVE mg/dL   Protein, ur NEGATIVE  NEGATIVE mg/dL   Urobilinogen, UA 1.0  0.0 - 1.0 mg/dL   Nitrite NEGATIVE  NEGATIVE   Leukocytes, UA SMALL (*) NEGATIVE  URINE MICROSCOPIC-ADD ON      Result Value Ref Range   Squamous Epithelial / LPF RARE  RARE   WBC, UA 3-6  <3 WBC/hpf   Bacteria, UA RARE  RARE   Dg Lumbar Spine Complete  04/14/2014   CLINICAL DATA:  Chronic back pain with recent worsening.  EXAM: LUMBAR SPINE - COMPLETE 4+ VIEW  COMPARISON:  None.  FINDINGS: No acute abnormality such as fracture or osseous erosion. No evidence of bone lesion. No subluxation.  There is diffuse degenerative disc disease of the lumbar spine, with up to moderate disc narrowing and spondylotic spurring. Mild lumbar dextroscoliosis which could be fixed or positional.  IMPRESSION: 1. No acute osseous findings. 2. Diffuse lumbar degenerative disc disease.   Electronically Signed   By: Jorje Guild M.D.   On: 04/14/2014 03:16     MDM   Labs. Xr.  Ultram po.  Reviewed nursing notes and prior charts for additional history.   pts pain/symptoms appears musculoskeletal in etiology.  Comfortable on recheck.  Left eye exam c/w conjunctivitis, +eyelash matting.  No contacts. No eye pain or change in vision. rx tobrex.       Mirna Mires, MD 04/14/14 [phone]

## 2014-07-10 ENCOUNTER — Encounter (HOSPITAL_COMMUNITY): Payer: Self-pay | Admitting: Emergency Medicine

## 2016-10-08 ENCOUNTER — Emergency Department (HOSPITAL_COMMUNITY)
Admission: EM | Admit: 2016-10-08 | Discharge: 2016-10-08 | Disposition: A | Discharge status: Home / Self Care | Payer: Medicare HMO | Attending: Physician Assistant | Admitting: Physician Assistant

## 2016-10-08 ENCOUNTER — Emergency Department (HOSPITAL_COMMUNITY): Payer: Medicare HMO

## 2016-10-08 ENCOUNTER — Encounter (HOSPITAL_COMMUNITY): Payer: Self-pay

## 2016-10-08 DIAGNOSIS — J069 Acute upper respiratory infection, unspecified: Secondary | ICD-10-CM | POA: Diagnosis not present

## 2016-10-08 DIAGNOSIS — Y999 Unspecified external cause status: Secondary | ICD-10-CM | POA: Diagnosis not present

## 2016-10-08 DIAGNOSIS — R69 Illness, unspecified: Secondary | ICD-10-CM | POA: Diagnosis not present

## 2016-10-08 DIAGNOSIS — Z7982 Long term (current) use of aspirin: Secondary | ICD-10-CM | POA: Insufficient documentation

## 2016-10-08 DIAGNOSIS — F17210 Nicotine dependence, cigarettes, uncomplicated: Secondary | ICD-10-CM | POA: Insufficient documentation

## 2016-10-08 DIAGNOSIS — Y939 Activity, unspecified: Secondary | ICD-10-CM | POA: Insufficient documentation

## 2016-10-08 DIAGNOSIS — S6990XA Unspecified injury of unspecified wrist, hand and finger(s), initial encounter: Secondary | ICD-10-CM

## 2016-10-08 DIAGNOSIS — Y929 Unspecified place or not applicable: Secondary | ICD-10-CM | POA: Insufficient documentation

## 2016-10-08 DIAGNOSIS — W230XXA Caught, crushed, jammed, or pinched between moving objects, initial encounter: Secondary | ICD-10-CM | POA: Diagnosis not present

## 2016-10-08 DIAGNOSIS — M7989 Other specified soft tissue disorders: Secondary | ICD-10-CM | POA: Diagnosis not present

## 2016-10-08 DIAGNOSIS — S6992XA Unspecified injury of left wrist, hand and finger(s), initial encounter: Secondary | ICD-10-CM | POA: Diagnosis not present

## 2016-10-08 LAB — URINALYSIS, ROUTINE W REFLEX MICROSCOPIC
Bilirubin Urine: NEGATIVE
GLUCOSE, UA: NEGATIVE mg/dL
Ketones, ur: NEGATIVE mg/dL
Leukocytes, UA: NEGATIVE
Nitrite: NEGATIVE
PH: 7 (ref 5.0–8.0)
Protein, ur: NEGATIVE mg/dL
SQUAMOUS EPITHELIAL / LPF: NONE SEEN
Specific Gravity, Urine: 1.009 (ref 1.005–1.030)

## 2016-10-08 MED ORDER — AZITHROMYCIN 250 MG PO TABS: 250.0000 mg | ORAL_TABLET | Freq: Every day | ORAL | 0 refills | Status: DC

## 2016-10-08 NOTE — ED Provider Notes (Signed)
[facility] DEPT Provider Note   CSN: [id number] Arrival date & time: 10/08/16  1554     History   Chief Complaint Chief Complaint  Patient presents with  . Dysuria  . Generalized Body Aches  . Hand Pain    HPI Krista Jimenez is a 66 y.o. female.  HPI here for evaluation of multiple complaints. Patient reports she has had URI symptoms, cough, nasal drainage over the past 2 weeks with minimal relief with OTC medications. She reports mild burning sensation today with urination. She also reports 3 weeks ago she jammed her finger while trying to turn the water off on her bathtub and is still experiencing pain. She denies fevers, chills, chest pain, shortness of breath, abdominal pain, numbness or weakness. No other remedies tried, no other modifying factors.  Past Medical History:  Diagnosis Date  . Heart murmur   . Hiatal hernia   . Ovarian cyst   . Spastic colon     Patient Active Problem List   Diagnosis Date Noted  . Cervical stenosis (uterine cervix) 12/17/2011  . Postmenopausal bleeding 11/26/2011    Past Surgical History:  Procedure Laterality Date  . [place]  . DILATION AND CURETTAGE OF UTERUS    . HYSTEROSCOPY W/D&C  12/17/2011   Procedure: DILATATION AND CURETTAGE /HYSTEROSCOPY;  Surgeon: Osborne Oman, MD;  Location: [facility];  Service: Gynecology;  Laterality: N/A;  . LAPAROSCOPY     removal of ectopic preg  . TUBAL LIGATION  1985    OB History    Gravida Para Term Preterm AB Living   4 2 2  0 2 2   SAB TAB Ectopic Multiple Live Births   1 0 1 0         Home Medications    Prior to Admission medications   Medication Sig Start Date End Date Taking? Authorizing Provider  aspirin 81 MG tablet Take 81 mg by mouth daily.    Historical Provider, MD  azithromycin (ZITHROMAX) 250 MG tablet Take 1 tablet (250 mg total) by mouth daily. Take first 2 tablets together, then 1 every day until finished. 10/08/16   Comer Locket, PA-C    HYDROcodone-acetaminophen (NORCO/VICODIN) 5-325 MG per tablet Take 1-2 tablets by mouth every 6 (six) hours as needed for moderate pain. 04/14/14   Lajean Saver, MD    Family History Family History  Problem Relation Age of Onset  . Hypertension Mother   . Aneurysm Mother   . Stroke Mother   . Hypertension Father   . Cancer Father     prostate  . Stroke Father   . Heart disease Sister   . Anesthesia problems Neg Hx     Social History Social History  Substance Use Topics  . Smoking status: Current Every Day Smoker    Packs/day: 1.00    Years: 39.00    Types: Cigarettes  . Smokeless tobacco: Never Used  . Alcohol use Yes     Comment: occ beer     Allergies   Penicillins   Review of Systems Review of Systems A 10 point review of systems was completed and was negative except for pertinent positives and negatives as mentioned in the history of present illness    Physical Exam Updated Vital Signs BP 154/86   Pulse 73   Temp 97.7 F (36.5 C) (Oral)   Resp 18   SpO2 99%   Physical Exam  Constitutional: She appears well-developed. No distress.  Awake, alert  and nontoxic in appearance  HENT:  Head: Normocephalic and atraumatic.  Right Ear: External ear normal.  Left Ear: External ear normal.  Mouth/Throat: Oropharynx is clear and moist.  Eyes: Conjunctivae and EOM are normal. Pupils are equal, round, and reactive to light.  Neck: Normal range of motion. No JVD present.  Cardiovascular: Normal rate, regular rhythm and normal heart sounds.   Pulmonary/Chest: Effort normal and breath sounds normal. No stridor.  Abdominal: Soft. There is no tenderness.  Musculoskeletal: Normal range of motion.  Left Little finger DIP held in flexion. Unable to fully extend.  Neurological:  Awake, alert, cooperative and aware of situation; motor strength bilaterally; sensation normal to light touch bilaterally; no facial asymmetry; tongue midline; major cranial nerves appear intact;   baseline gait without new ataxia.  Skin: No rash noted. She is not diaphoretic.  Psychiatric: She has a normal mood and affect. Her behavior is normal. Thought content normal.  Nursing note and vitals reviewed.    ED Treatments / Results  Labs (all labs ordered are listed, but only abnormal results are displayed) Labs Reviewed  URINALYSIS, ROUTINE W REFLEX MICROSCOPIC - Abnormal; Notable for the following:       Result Value   Color, Urine STRAW (*)    Hgb urine dipstick SMALL (*)    Bacteria, UA RARE (*)    All other components within normal limits    EKG  EKG Interpretation None       Radiology Dg Finger Little Left  Result Date: 10/08/2016 CLINICAL DATA:  Jammed 3 weeks ago striking a bath tub. Pain and swelling. EXAM: LEFT LITTLE FINGER 2+V COMPARISON:  None. FINDINGS: Flexion deformity at the DIP joint without evidence of fracture. This can be seen with extensor tendon tear. No evidence of pre-existing arthritis or other focal lesion. IMPRESSION: Flexion deformity at the DIP joints suggesting extensor tendon tear. Electronically Signed   By: Nelson Chimes M.D.   On: 10/08/2016 18:26    Procedures Procedures (including critical care time)  Medications Ordered in ED Medications - No data to display   Initial Impression / Assessment and Plan / ED Course  I have reviewed the triage vital signs and the nursing notes.  Pertinent labs & imaging results that were available during my care of the patient were reviewed by me and considered in my medical decision making (see chart for details).     Due to prolonged URI symptoms, history smoking, will treat empirically with azithromycin.  No evidence of overt urinary infection- follow-up with PCP if urinary symptoms do not improve While the injury occurred several weeks ago, Placed left Little finger in splint, suspect extensor tendon damage. PCP f/u. Discussed return precautions discussed.  Final Clinical Impressions(s) / ED  Diagnoses   Final diagnoses:  Upper respiratory tract infection, unspecified type  Injury of little finger    New Prescriptions New Prescriptions   AZITHROMYCIN (ZITHROMAX) 250 MG TABLET    Take 1 tablet (250 mg total) by mouth daily. Take first 2 tablets together, then 1 every day until finished.     Comer Locket, PA-C 10/08/16 2046    [place]ana, MD 10/08/16 2357

## 2016-10-08 NOTE — ED Triage Notes (Signed)
Pt complaining of nasal congestion, cough and generalized malaise x 1 week. Pt states fever and chills. Pt also complaining of L 5th finger pain. Pt states struck finger in bathtub.

## 2016-10-08 NOTE — Discharge Instructions (Signed)
Please take your medications as prescribed. You will need to wear your splint over the next 6 weeks as we discussed. Follow-up with your PCP for reevaluation. Continue supportive care at home. Return to ED for new or worsening symptoms as we discussed.

## 2016-10-08 NOTE — ED Notes (Signed)
Patient Alert and oriented X4. Stable and ambulatory. Patient verbalized understanding of the discharge instructions.  Patient belongings were taken by the patient.  

## 2016-10-08 NOTE — ED Triage Notes (Signed)
Pt also complaining of burning/itching with urination.

## 2016-10-08 NOTE — ED Notes (Signed)
PA at bedside.

## 2016-11-25 DIAGNOSIS — M1711 Unilateral primary osteoarthritis, right knee: Secondary | ICD-10-CM | POA: Diagnosis not present

## 2016-11-25 DIAGNOSIS — R1310 Dysphagia, unspecified: Secondary | ICD-10-CM | POA: Diagnosis not present

## 2016-11-25 DIAGNOSIS — K08409 Partial loss of teeth, unspecified cause, unspecified class: Secondary | ICD-10-CM | POA: Diagnosis not present

## 2016-11-25 DIAGNOSIS — G4700 Insomnia, unspecified: Secondary | ICD-10-CM | POA: Diagnosis not present

## 2016-11-25 DIAGNOSIS — Z0000 Encounter for general adult medical examination without abnormal findings: Secondary | ICD-10-CM | POA: Diagnosis not present

## 2016-11-25 DIAGNOSIS — M170 Bilateral primary osteoarthritis of knee: Secondary | ICD-10-CM | POA: Diagnosis not present

## 2016-11-25 DIAGNOSIS — M1712 Unilateral primary osteoarthritis, left knee: Secondary | ICD-10-CM | POA: Diagnosis not present

## 2016-11-25 DIAGNOSIS — R69 Illness, unspecified: Secondary | ICD-10-CM | POA: Diagnosis not present

## 2016-11-25 DIAGNOSIS — M5136 Other intervertebral disc degeneration, lumbar region: Secondary | ICD-10-CM | POA: Diagnosis not present

## 2016-11-25 DIAGNOSIS — M47816 Spondylosis without myelopathy or radiculopathy, lumbar region: Secondary | ICD-10-CM | POA: Diagnosis not present

## 2016-11-25 DIAGNOSIS — Z7982 Long term (current) use of aspirin: Secondary | ICD-10-CM | POA: Diagnosis not present

## 2016-11-25 DIAGNOSIS — R0609 Other forms of dyspnea: Secondary | ICD-10-CM | POA: Diagnosis not present

## 2016-11-25 DIAGNOSIS — R208 Other disturbances of skin sensation: Secondary | ICD-10-CM | POA: Diagnosis not present

## 2016-11-25 DIAGNOSIS — M19041 Primary osteoarthritis, right hand: Secondary | ICD-10-CM | POA: Diagnosis not present

## 2019-06-29 DIAGNOSIS — Z23 Encounter for immunization: Secondary | ICD-10-CM | POA: Diagnosis not present

## 2019-06-29 DIAGNOSIS — R7989 Other specified abnormal findings of blood chemistry: Secondary | ICD-10-CM | POA: Diagnosis not present

## 2019-06-29 DIAGNOSIS — R7303 Prediabetes: Secondary | ICD-10-CM | POA: Diagnosis not present

## 2019-06-29 DIAGNOSIS — R7309 Other abnormal glucose: Secondary | ICD-10-CM | POA: Diagnosis not present

## 2019-06-29 DIAGNOSIS — R296 Repeated falls: Secondary | ICD-10-CM | POA: Diagnosis not present

## 2019-06-29 DIAGNOSIS — N3946 Mixed incontinence: Secondary | ICD-10-CM | POA: Diagnosis not present

## 2019-06-29 DIAGNOSIS — R2689 Other abnormalities of gait and mobility: Secondary | ICD-10-CM | POA: Diagnosis not present

## 2019-06-29 DIAGNOSIS — R69 Illness, unspecified: Secondary | ICD-10-CM | POA: Diagnosis not present

## 2019-06-29 DIAGNOSIS — Z0000 Encounter for general adult medical examination without abnormal findings: Secondary | ICD-10-CM | POA: Diagnosis not present

## 2019-07-11 DIAGNOSIS — R269 Unspecified abnormalities of gait and mobility: Secondary | ICD-10-CM | POA: Diagnosis not present

## 2019-07-13 DIAGNOSIS — Z1212 Encounter for screening for malignant neoplasm of rectum: Secondary | ICD-10-CM | POA: Diagnosis not present

## 2019-07-13 DIAGNOSIS — Z1211 Encounter for screening for malignant neoplasm of colon: Secondary | ICD-10-CM | POA: Diagnosis not present

## 2019-07-19 DIAGNOSIS — J439 Emphysema, unspecified: Secondary | ICD-10-CM | POA: Diagnosis not present

## 2019-07-19 DIAGNOSIS — Z122 Encounter for screening for malignant neoplasm of respiratory organs: Secondary | ICD-10-CM | POA: Diagnosis not present

## 2019-07-19 DIAGNOSIS — R69 Illness, unspecified: Secondary | ICD-10-CM | POA: Diagnosis not present

## 2019-08-09 ENCOUNTER — Other Ambulatory Visit: Payer: Self-pay

## 2019-08-09 DIAGNOSIS — N3946 Mixed incontinence: Secondary | ICD-10-CM | POA: Diagnosis not present

## 2019-08-09 DIAGNOSIS — R351 Nocturia: Secondary | ICD-10-CM | POA: Diagnosis not present

## 2019-08-09 DIAGNOSIS — Z1231 Encounter for screening mammogram for malignant neoplasm of breast: Secondary | ICD-10-CM

## 2019-08-09 DIAGNOSIS — R350 Frequency of micturition: Secondary | ICD-10-CM | POA: Diagnosis not present

## 2019-08-17 ENCOUNTER — Ambulatory Visit: Payer: Medicare Other

## 2019-08-17 DIAGNOSIS — Z1231 Encounter for screening mammogram for malignant neoplasm of breast: Secondary | ICD-10-CM | POA: Diagnosis not present

## 2019-11-03 ENCOUNTER — Ambulatory Visit: Payer: Medicare HMO | Attending: Internal Medicine

## 2019-11-03 DIAGNOSIS — Z23 Encounter for immunization: Secondary | ICD-10-CM

## 2019-11-03 NOTE — Progress Notes (Signed)
   Covid-19 Vaccination Clinic  Name:  Krista Jimenez    MRN: [id number] DOB: [birth year]  11/03/2019  Krista Jimenez was observed post Covid-19 immunization for 15 minutes without incidence. She was provided with Vaccine Information Sheet and instruction to access the V-Safe system.   Krista Jimenez was instructed to call 911 with any severe reactions post vaccine: Marland Kitchen Difficulty breathing  . Swelling of your face and throat  . A fast heartbeat  . A bad rash all over your body  . Dizziness and weakness    Immunizations Administered    Name Date Dose VIS Date Route   Pfizer COVID-19 Vaccine 11/03/2019  9:46 AM 0.3 mL 08/19/2019 Intramuscular   Manufacturer: [place]   Lot: [id number]   [place]: [id number]

## 2019-11-23 ENCOUNTER — Ambulatory Visit: Payer: Medicare HMO | Attending: Internal Medicine

## 2019-11-23 DIAGNOSIS — Z23 Encounter for immunization: Secondary | ICD-10-CM

## 2019-11-23 NOTE — Progress Notes (Signed)
   Covid-19 Vaccination Clinic  Name:  Krista Jimenez    MRN: [id number] DOB: [birth year]  11/23/2019  [name] was observed post Covid-19 immunization for 15 minutes without incident. She was provided with Vaccine Information Sheet and instruction to access the V-Safe system.   Ms. Labor was instructed to call 911 with any severe reactions post vaccine: Marland Kitchen Difficulty breathing  . Swelling of face and throat  . A fast heartbeat  . A bad rash all over body  . Dizziness and weakness   Immunizations Administered    Name Date Dose VIS Date Route   Pfizer COVID-19 Vaccine 11/23/2019 12:05 PM 0.3 mL 08/19/2019 Intramuscular   Manufacturer: [place]   Lot: [id number]   [place]: [id number]

## 2020-04-18 ENCOUNTER — Ambulatory Visit: Payer: Self-pay | Admitting: Cardiology

## 2020-04-23 ENCOUNTER — Ambulatory Visit: Payer: Medicare HMO | Admitting: Cardiology

## 2020-04-23 ENCOUNTER — Encounter: Payer: Self-pay | Admitting: Cardiology

## 2020-04-23 ENCOUNTER — Other Ambulatory Visit: Payer: Self-pay

## 2020-04-23 VITALS — BP 137/55 | HR 83 | Resp 17 | Ht 62.0 in | Wt 239.0 lb

## 2020-04-23 DIAGNOSIS — I10 Essential (primary) hypertension: Secondary | ICD-10-CM

## 2020-04-23 DIAGNOSIS — E039 Hypothyroidism, unspecified: Secondary | ICD-10-CM

## 2020-04-23 DIAGNOSIS — Z6841 Body Mass Index (BMI) 40.0 and over, adult: Secondary | ICD-10-CM

## 2020-04-23 DIAGNOSIS — I209 Angina pectoris, unspecified: Secondary | ICD-10-CM

## 2020-04-23 DIAGNOSIS — F17210 Nicotine dependence, cigarettes, uncomplicated: Secondary | ICD-10-CM

## 2020-04-23 DIAGNOSIS — E782 Mixed hyperlipidemia: Secondary | ICD-10-CM

## 2020-04-23 DIAGNOSIS — R9431 Abnormal electrocardiogram [ECG] [EKG]: Secondary | ICD-10-CM

## 2020-04-23 MED ORDER — METOPROLOL SUCCINATE ER 25 MG PO TB24: 25.0000 mg | ORAL_TABLET | Freq: Every day | ORAL | 0 refills | Status: DC

## 2020-04-23 MED ORDER — NITROGLYCERIN 0.4 MG SL SUBL: 0.4000 mg | SUBLINGUAL_TABLET | SUBLINGUAL | 0 refills | Status: DC | PRN

## 2020-04-23 NOTE — Progress Notes (Signed)
Date:  04/23/2020   ID:  Krista Jimenez, DOB [birth year], MRN [id number]  PCP:  Pieter Partridge, PA  Cardiologist:  Rex Kras, DO, [facility] (established care 04/23/2020)  REASON FOR CONSULT: Chest pain and Q waves suggestive of previous MI.   REQUESTING PHYSICIAN:  Pieter Partridge, [address] [place] [address] [place],  [place] [address]  Chief Complaint  Patient presents with  . Chest Pain  . New Patient (Initial Visit)    HPI  Krista Jimenez is a 69 y.o. female who presents to the office with a chief complaint of " chest pain." She is referred to the office at the request of Pieter Partridge, [place]. Patient's past medical history and cardiovascular risk factors include: hypertension, hyperlipidemia, hypothyroidism, smoking cigarette 1ppd, obesity due to excess calorie, postmenopausal female, advanced age.  Patient is referred to the office at the request of her primary care provider for evaluation of chest pain and EKG finding of an old myocardial infarction.  Patient is accompanied by her granddaughter Nunzio Cory at today's office visit.  Patient states that she recently went to her yearly physical and had an EKG performed and was told that she has had old heart attack.  Patient was not aware of this finding and was asked to see cardiology because she was also having chest pain.  Patient states that she is been having chest discomfort for the last 1 year predominantly after eating and at times substernal discomfort.  She feels that is pressure-like/ache-like sensation, 4 out of 10 in intensity, usually last for a few minutes but can be up to 30 minutes at a time.  She has been attributing this to indigestion.  She takes aspirin and the symptoms do improve.  At times she also rubs Vicks VapoRub and her symptoms improved.  No improving or worsening factors.  Patient is not physically active on a daily basis to comment if the symptoms are brought on by effort related activities.  But the symptoms do  resolve with resting.  Patient states her last episode was yesterday night.  No active chest pain at the time of evaluation.  No family history of premature coronary disease or sudden cardiac death.  Denies prior history of coronary artery disease, congestive heart failure, deep venous thrombosis, pulmonary embolism, stroke, transient ischemic attack.  FUNCTIONAL STATUS: No structured exercise program or daily routine.   ALLERGIES: Allergies  Allergen Reactions  . Penicillins Other (See Comments)    Childhood reaction; reaction unknown    MEDICATION LIST PRIOR TO VISIT: Current Meds  Medication Sig  . aspirin 81 MG tablet Take 81 mg by mouth daily.  Marland Kitchen levothyroxine (SYNTHROID) 100 MCG tablet Take 1 tablet by mouth daily.  Marland Kitchen losartan (COZAAR) 50 MG tablet Take 1 tablet by mouth daily.  . meloxicam (MOBIC) 15 MG tablet Take 1 tablet by mouth daily.  . mirabegron ER (MYRBETRIQ) 50 MG TB24 tablet Take 1 tablet by mouth daily.  . rosuvastatin (CRESTOR) 10 MG tablet Take 1 tablet by mouth daily.     PAST MEDICAL HISTORY: Past Medical History:  Diagnosis Date  . Hiatal hernia   . Hyperlipidemia   . Hypertension   . Ovarian cyst   . Spastic colon     PAST SURGICAL HISTORY: Past Surgical History:  Procedure Laterality Date  . [place]  . DILATION AND CURETTAGE OF UTERUS    . HYSTEROSCOPY WITH D & C  12/17/2011   Procedure: DILATATION  AND CURETTAGE /HYSTEROSCOPY;  Surgeon: Osborne Oman, MD;  Location: [facility];  Service: Gynecology;  Laterality: N/A;  . LAPAROSCOPY     removal of ectopic preg  . TUBAL LIGATION  1985    FAMILY HISTORY: The patient family history includes Aneurysm in her mother; Cancer in her brother and father; Heart disease in her sister; Hypertension in her brother, brother, brother, brother, father, and mother; Stroke in her father and mother.  SOCIAL HISTORY:  The patient  reports that she has been smoking cigarettes. She has a  39.00 pack-year smoking history. She has never used smokeless tobacco. She reports current alcohol use. She reports that she does not use drugs.  REVIEW OF SYSTEMS: Review of Systems  Constitutional: Negative for chills and fever.  HENT: Negative for hoarse voice and nosebleeds.   Eyes: Negative for discharge, double vision and pain.  Cardiovascular: Positive for chest pain and dyspnea on exertion. Negative for claudication, leg swelling, near-syncope, orthopnea, palpitations, paroxysmal nocturnal dyspnea and syncope.  Respiratory: Negative for hemoptysis and shortness of breath.   Musculoskeletal: Negative for muscle cramps and myalgias.  Gastrointestinal: Negative for abdominal pain, constipation, diarrhea, hematemesis, hematochezia, melena, nausea and vomiting.  Neurological: Negative for dizziness and light-headedness.   PHYSICAL EXAM: Vitals with BMI 04/23/2020 10/08/2016 10/08/2016  Height _0  - -  Weight 239 lbs - -  BMI 40.1 - -  Systolic 027 253 664  Diastolic 55 88 86  Pulse 83 70 73    CONSTITUTIONAL: Well-developed and well-nourished. No acute distress.  SKIN: Skin is warm and dry. No rash noted. No cyanosis. No pallor. No jaundice HEAD: Normocephalic and atraumatic.  EYES: No scleral icterus MOUTH/THROAT: Moist oral membranes.  NECK: No JVD present. No thyromegaly noted. No carotid bruits  LYMPHATIC: No visible cervical adenopathy.  CHEST Normal respiratory effort. No intercostal retractions  LUNGS: Clear to auscultation bilaterally.  No stridor. No wheezes. No rales.  CARDIOVASCULAR: Regular rate and rhythm, positive Q0-H4, soft holosystolic murmur, no gallops or rubs. ABDOMINAL: Obese, soft, nontender, nondistended, positive bowel sounds in all 4 quadrants no apparent ascites.  EXTREMITIES: No peripheral edema  HEMATOLOGIC: No significant bruising NEUROLOGIC: Oriented to person, place, and time. Nonfocal. Normal muscle tone.  PSYCHIATRIC: Normal mood and affect.  Normal behavior. Cooperative  CARDIAC DATABASE: EKG: 04/23/2020:Sinus  Tachycardia, 101bpm, PRWP, left axis, LAFB, old inferior infarct.   Echocardiogram: None  Stress Testing: None  Heart Catheterization: None  LABORATORY DATA: External Labs: Collected: February 23, 2020 Creatinine 0.69 mg/dL. eGFR: 88 mL/min per 1.73 m Lipid profile: Total cholesterol 102, triglycerides 92, HDL 34, LDL 58, non-HDL 68 TSH: 31.4 Hemoglobin A1c: 6  IMPRESSION:    ICD-10-CM   1. Angina pectoris (HCC)  I20.9 EKG 12-Lead    PCV ECHOCARDIOGRAM COMPLETE    nitroGLYCERIN (NITROSTAT) 0.4 MG SL tablet    metoprolol succinate (TOPROL XL) 25 MG 24 hr tablet    Basic metabolic panel    CBC    SARS-COV-2 RNA,(COVID-19) QUAL NAAT  2. Nonspecific abnormal electrocardiogram (ECG) (EKG)  R94.31 EKG 12-Lead  3. Benign hypertension  I10   4. Mixed hyperlipidemia  E78.2   5. Hypothyroidism, unspecified type  E03.9   6. Continuous dependence on cigarette smoking  F17.210   7. Class 3 severe obesity due to excess calories with serious comorbidity and body mass index (BMI) of 40.0 to 44.9 in adult [facility])  E66.01    Z68.41      RECOMMENDATIONS: MIYANNA WIERSMA is a  69 y.o. female whose past medical history and cardiac risk factors include:  hypertension, hyperlipidemia, hypothyroidism, smoking cigarette 1ppd, obesity due to excess calorie, postmenopausal female, advanced age.  Angina pectoris:  Patient symptoms of chest discomfort appears to be anginal equivalent.  She has multiple cardiovascular risk factors as noted above.  Her pretest probability for underlying obstructive coronary artery disease is high and therefore invasive angiography with possible intervention was recommended.  Patient is agreeable to proceed with left heart catheterization with possible intervention.  The left heart catheterization procedure was explained to the patient and her granddaughter in detail. The indication, alternatives,  risks and benefits were reviewed. Complications including but not limited to bleeding, infection, acute kidney injury, blood transfusion, heart rhythm disturbances, contrast (dye) reaction, damage to the arteries or nerves in the legs or hands, cerebrovascular accident, myocardial infarction, need for emergent bypass surgery, blood clots in the legs, possible need for emergent blood transfusion, and rarely death were reviewed and discussed with the patient and her granddaughter. The patient and her granddaughter voices understanding and wishes to proceed.   Continue aspirin.  We will start Toprol-XL 25 mg p.o. every morning.  Continue statin therapy.  Prescribed sublingual nitroglycerin tablets to use on as needed basis.  Medication profile discussed.  Patient is asked to seek medical attention sooner at the closest hospital via EMS if her symptoms were to increase in intensity, frequency, duration, or has typical chest discomfort.  Both the patient and her granddaughter verbalized understanding.  Check BMP and CBC.  Screen for COVID-19 prior to elective left heart catheterization.  Benign essential hypertension: Currently being managed by PCP.  Medications reconciled.  Mixed hyperlipidemia: . Continue statin therapy.   . Follow lipids. . Currently managed by primary care provider. . Patient denies myalgia or other side effects.  Active tobacco use: Educated on importance of complete smoking cessation.  Independently reviewed outside labs from [facility] from care everywhere, independently reviewed the EKG from PCPs office.  Complex decision making in regards to management of angina pectoris as discussed above.  FINAL MEDICATION LIST END OF ENCOUNTER: Meds ordered this encounter  Medications  . nitroGLYCERIN (NITROSTAT) 0.4 MG SL tablet    Sig: Place 1 tablet (0.4 mg total) under the tongue every 5 (five) minutes as needed for chest pain. If you require more than two tablets five  minutes apart go to the nearest ER via EMS.    Dispense:  30 tablet    Refill:  0  . metoprolol succinate (TOPROL XL) 25 MG 24 hr tablet    Sig: Take 1 tablet (25 mg total) by mouth daily.    Dispense:  90 tablet    Refill:  0    Medications Discontinued During This Encounter  Medication Reason  . azithromycin (ZITHROMAX) 250 MG tablet Completed Course  . HYDROcodone-acetaminophen (NORCO/VICODIN) 5-325 MG per tablet Patient Preference     Current Outpatient Medications:  .  aspirin 81 MG tablet, Take 81 mg by mouth daily., Disp: , Rfl:  .  levothyroxine (SYNTHROID) 100 MCG tablet, Take 1 tablet by mouth daily., Disp: , Rfl:  .  losartan (COZAAR) 50 MG tablet, Take 1 tablet by mouth daily., Disp: , Rfl:  .  meloxicam (MOBIC) 15 MG tablet, Take 1 tablet by mouth daily., Disp: , Rfl:  .  mirabegron ER (MYRBETRIQ) 50 MG TB24 tablet, Take 1 tablet by mouth daily., Disp: , Rfl:  .  rosuvastatin (CRESTOR) 10 MG tablet, Take 1 tablet by  mouth daily., Disp: , Rfl:  .  metoprolol succinate (TOPROL XL) 25 MG 24 hr tablet, Take 1 tablet (25 mg total) by mouth daily., Disp: 90 tablet, Rfl: 0 .  nitroGLYCERIN (NITROSTAT) 0.4 MG SL tablet, Place 1 tablet (0.4 mg total) under the tongue every 5 (five) minutes as needed for chest pain. If you require more than two tablets five minutes apart go to the nearest ER via EMS., Disp: 30 tablet, Rfl: 0  Orders Placed This Encounter  Procedures  . SARS-COV-2 RNA,(COVID-19) QUAL NAAT  . Basic metabolic panel  . CBC  . EKG 12-Lead  . PCV ECHOCARDIOGRAM COMPLETE    There are no Patient Instructions on file for this visit.   --Continue cardiac medications as reconciled in final medication list. --Return in about 3 weeks (around 05/14/2020) for post heart catheterization  follow up.. Or sooner if needed. --Continue follow-up with your primary care physician regarding the management of your other chronic comorbid conditions.  Patient's questions and concerns  were addressed to her satisfaction. She voices understanding of the instructions provided during this encounter.   This note was created using a voice recognition software as a result there may be grammatical errors inadvertently enclosed that do not reflect the nature of this encounter. Every attempt is made to correct such errors.  Rex Kras, [place], [facility]  Pager: [phone] Office: [phone]

## 2020-05-02 LAB — CBC
Hematocrit: 47.4 % — ABNORMAL HIGH (ref 34.0–46.6)
Hemoglobin: 15.5 g/dL (ref 11.1–15.9)
MCH: 29.9 pg (ref 26.6–33.0)
MCHC: 32.7 g/dL (ref 31.5–35.7)
MCV: 91 fL (ref 79–97)
Platelets: 262 10*3/uL (ref 150–450)
RBC: 5.19 x10E6/uL (ref 3.77–5.28)
RDW: 12.3 % (ref 11.7–15.4)
WBC: 10.1 10*3/uL (ref 3.4–10.8)

## 2020-05-04 ENCOUNTER — Other Ambulatory Visit (HOSPITAL_COMMUNITY)
Admission: RE | Admit: 2020-05-04 | Discharge: 2020-05-04 | Disposition: A | Discharge status: Home / Self Care | Payer: Medicare HMO | Source: Ambulatory Visit | Attending: Cardiology | Admitting: Cardiology

## 2020-05-04 DIAGNOSIS — Z01812 Encounter for preprocedural laboratory examination: Secondary | ICD-10-CM | POA: Diagnosis present

## 2020-05-04 DIAGNOSIS — Z20822 Contact with and (suspected) exposure to covid-19: Secondary | ICD-10-CM | POA: Insufficient documentation

## 2020-05-04 LAB — SARS CORONAVIRUS 2 (TAT 6-24 HRS): SARS Coronavirus 2: NEGATIVE

## 2020-05-08 ENCOUNTER — Other Ambulatory Visit: Payer: Self-pay

## 2020-05-08 ENCOUNTER — Ambulatory Visit (HOSPITAL_COMMUNITY)
Admission: RE | Admit: 2020-05-08 | Discharge: 2020-05-09 | Disposition: A | Discharge status: Home / Self Care | Payer: Medicare HMO | Attending: Cardiology | Admitting: Cardiology

## 2020-05-08 ENCOUNTER — Other Ambulatory Visit: Payer: Medicare HMO

## 2020-05-08 ENCOUNTER — Encounter (HOSPITAL_COMMUNITY)
Admission: RE | Disposition: A | Discharge status: Home / Self Care | Payer: Self-pay | Source: Home / Self Care | Attending: Cardiology

## 2020-05-08 DIAGNOSIS — I10 Essential (primary) hypertension: Secondary | ICD-10-CM | POA: Diagnosis not present

## 2020-05-08 DIAGNOSIS — Z6841 Body Mass Index (BMI) 40.0 and over, adult: Secondary | ICD-10-CM | POA: Diagnosis not present

## 2020-05-08 DIAGNOSIS — Z79890 Hormone replacement therapy (postmenopausal): Secondary | ICD-10-CM | POA: Insufficient documentation

## 2020-05-08 DIAGNOSIS — Z880 Allergy status to penicillin: Secondary | ICD-10-CM | POA: Insufficient documentation

## 2020-05-08 DIAGNOSIS — Z79899 Other long term (current) drug therapy: Secondary | ICD-10-CM | POA: Diagnosis not present

## 2020-05-08 DIAGNOSIS — E039 Hypothyroidism, unspecified: Secondary | ICD-10-CM | POA: Insufficient documentation

## 2020-05-08 DIAGNOSIS — E669 Obesity, unspecified: Secondary | ICD-10-CM | POA: Insufficient documentation

## 2020-05-08 DIAGNOSIS — Z7982 Long term (current) use of aspirin: Secondary | ICD-10-CM | POA: Insufficient documentation

## 2020-05-08 DIAGNOSIS — E785 Hyperlipidemia, unspecified: Secondary | ICD-10-CM | POA: Insufficient documentation

## 2020-05-08 DIAGNOSIS — I25110 Atherosclerotic heart disease of native coronary artery with unstable angina pectoris: Secondary | ICD-10-CM | POA: Insufficient documentation

## 2020-05-08 DIAGNOSIS — I209 Angina pectoris, unspecified: Secondary | ICD-10-CM | POA: Diagnosis present

## 2020-05-08 DIAGNOSIS — Z9861 Coronary angioplasty status: Secondary | ICD-10-CM

## 2020-05-08 DIAGNOSIS — F17210 Nicotine dependence, cigarettes, uncomplicated: Secondary | ICD-10-CM | POA: Diagnosis not present

## 2020-05-08 HISTORY — PX: CORONARY BALLOON ANGIOPLASTY: CATH118233

## 2020-05-08 HISTORY — PX: INTRAVASCULAR ULTRASOUND/IVUS: CATH118244

## 2020-05-08 HISTORY — PX: LEFT HEART CATH AND CORONARY ANGIOGRAPHY: CATH118249

## 2020-05-08 LAB — BASIC METABOLIC PANEL
Anion gap: 10 (ref 5–15)
BUN: 11 mg/dL (ref 8–23)
CO2: 24 mmol/L (ref 22–32)
Calcium: 9.8 mg/dL (ref 8.9–10.3)
Chloride: 107 mmol/L (ref 98–111)
Creatinine, Ser: 0.67 mg/dL (ref 0.44–1.00)
GFR calc Af Amer: >60 mL/min (ref >60)
GFR calc non Af Amer: >60 mL/min (ref >60)
Glucose, Bld: 99 mg/dL (ref 70–99)
Potassium: 3.7 mmol/L (ref 3.5–5.1)
Sodium: 141 mmol/L (ref 135–145)

## 2020-05-08 LAB — POCT ACTIVATED CLOTTING TIME
Activated Clotting Time: 263 seconds
Activated Clotting Time: 301 seconds

## 2020-05-08 SURGERY — LEFT HEART CATH AND CORONARY ANGIOGRAPHY
Anesthesia: LOCAL

## 2020-05-08 MED ORDER — FENTANYL CITRATE (PF) 100 MCG/2ML IJ SOLN
INTRAMUSCULAR | Status: AC
  Filled 2020-05-08: qty 2

## 2020-05-08 MED ORDER — MIDAZOLAM HCL 2 MG/2ML IJ SOLN
INTRAMUSCULAR | Status: DC | PRN
  Administered 2020-05-08: 1 mg via INTRAVENOUS
  Administered 2020-05-08 (×2): 2 mg via INTRAVENOUS

## 2020-05-08 MED ORDER — HEPARIN SODIUM (PORCINE) 1000 UNIT/ML IJ SOLN
INTRAMUSCULAR | Status: DC | PRN
  Administered 2020-05-08 (×2): 3000 [IU] via INTRAVENOUS
  Administered 2020-05-08: 6000 [IU] via INTRAVENOUS
  Administered 2020-05-08: 5000 [IU] via INTRAVENOUS

## 2020-05-08 MED ORDER — ACETAMINOPHEN 325 MG PO TABS: 650.0000 mg | ORAL_TABLET | ORAL | Status: DC | PRN

## 2020-05-08 MED ORDER — CLOPIDOGREL BISULFATE 75 MG PO TABS
75.0000 mg | ORAL_TABLET | Freq: Every day | ORAL | Status: DC
  Administered 2020-05-09: 75 mg via ORAL
  Filled 2020-05-08: qty 1

## 2020-05-08 MED ORDER — MIRABEGRON ER 50 MG PO TB24
50.0000 mg | ORAL_TABLET | Freq: Every day | ORAL | Status: DC
  Administered 2020-05-09: 50 mg via ORAL
  Filled 2020-05-08: qty 1

## 2020-05-08 MED ORDER — SODIUM CHLORIDE 0.9 % IV SOLN: 250.0000 mL | INTRAVENOUS | Status: DC | PRN

## 2020-05-08 MED ORDER — SODIUM CHLORIDE 0.9% FLUSH: 3.0000 mL | Freq: Two times a day (BID) | INTRAVENOUS | Status: DC

## 2020-05-08 MED ORDER — FAMOTIDINE IN NACL 20-0.9 MG/50ML-% IV SOLN
INTRAVENOUS | Status: AC | PRN
  Administered 2020-05-08: 20 mg via INTRAVENOUS

## 2020-05-08 MED ORDER — ASPIRIN EC 81 MG PO TBEC
81.0000 mg | DELAYED_RELEASE_TABLET | Freq: Every day | ORAL | Status: DC
  Administered 2020-05-09: 81 mg via ORAL
  Filled 2020-05-08: qty 1

## 2020-05-08 MED ORDER — MIDAZOLAM HCL 2 MG/2ML IJ SOLN
INTRAMUSCULAR | Status: AC
  Filled 2020-05-08: qty 2

## 2020-05-08 MED ORDER — SODIUM CHLORIDE 0.9 % WEIGHT BASED INFUSION: 1.0000 mL/kg/h | INTRAVENOUS | Status: DC

## 2020-05-08 MED ORDER — HYDRALAZINE HCL 20 MG/ML IJ SOLN
INTRAMUSCULAR | Status: AC
  Filled 2020-05-08: qty 1

## 2020-05-08 MED ORDER — LIDOCAINE HCL (PF) 1 % IJ SOLN
INTRAMUSCULAR | Status: AC
  Filled 2020-05-08: qty 30

## 2020-05-08 MED ORDER — ISOSORBIDE MONONITRATE ER 30 MG PO TB24
30.0000 mg | ORAL_TABLET | Freq: Every day | ORAL | Status: DC
  Administered 2020-05-08 – 2020-05-09 (×2): 30 mg via ORAL
  Filled 2020-05-08 (×2): qty 1

## 2020-05-08 MED ORDER — ASPIRIN 81 MG PO CHEW: 81.0000 mg | CHEWABLE_TABLET | ORAL | Status: DC

## 2020-05-08 MED ORDER — HEPARIN (PORCINE) IN NACL 1000-0.9 UT/500ML-% IV SOLN
INTRAVENOUS | Status: AC
  Filled 2020-05-08: qty 1000

## 2020-05-08 MED ORDER — VERAPAMIL HCL 2.5 MG/ML IV SOLN
INTRAVENOUS | Status: DC | PRN
  Administered 2020-05-08: 5 mL via INTRA_ARTERIAL

## 2020-05-08 MED ORDER — SODIUM CHLORIDE 0.9% FLUSH: 3.0000 mL | INTRAVENOUS | Status: DC | PRN

## 2020-05-08 MED ORDER — LOSARTAN POTASSIUM 50 MG PO TABS
50.0000 mg | ORAL_TABLET | Freq: Every day | ORAL | Status: DC
  Administered 2020-05-09: 50 mg via ORAL
  Filled 2020-05-08: qty 1

## 2020-05-08 MED ORDER — FAMOTIDINE IN NACL 20-0.9 MG/50ML-% IV SOLN
INTRAVENOUS | Status: AC
  Filled 2020-05-08: qty 50

## 2020-05-08 MED ORDER — VERAPAMIL HCL 2.5 MG/ML IV SOLN
INTRAVENOUS | Status: AC
  Filled 2020-05-08: qty 2

## 2020-05-08 MED ORDER — LIDOCAINE HCL (PF) 1 % IJ SOLN
INTRAMUSCULAR | Status: DC | PRN
  Administered 2020-05-08: 7 mL via SUBCUTANEOUS

## 2020-05-08 MED ORDER — IOHEXOL 350 MG/ML SOLN
INTRAVENOUS | Status: DC | PRN
  Administered 2020-05-08: 90 mL

## 2020-05-08 MED ORDER — HYDRALAZINE HCL 20 MG/ML IJ SOLN: 10.0000 mg | INTRAMUSCULAR | Status: AC | PRN

## 2020-05-08 MED ORDER — SODIUM CHLORIDE 0.9 % IV SOLN: INTRAVENOUS | Status: AC

## 2020-05-08 MED ORDER — HYDRALAZINE HCL 20 MG/ML IJ SOLN
INTRAMUSCULAR | Status: DC | PRN
  Administered 2020-05-08: 10 mg via INTRAVENOUS

## 2020-05-08 MED ORDER — NITROGLYCERIN 1 MG/10 ML FOR IR/CATH LAB
INTRA_ARTERIAL | Status: AC
  Filled 2020-05-08: qty 10

## 2020-05-08 MED ORDER — LABETALOL HCL 5 MG/ML IV SOLN: 10.0000 mg | INTRAVENOUS | Status: AC | PRN

## 2020-05-08 MED ORDER — ROSUVASTATIN CALCIUM 5 MG PO TABS
10.0000 mg | ORAL_TABLET | Freq: Every day | ORAL | Status: DC
  Administered 2020-05-09: 10 mg via ORAL
  Filled 2020-05-08 (×2): qty 2

## 2020-05-08 MED ORDER — METOPROLOL SUCCINATE ER 25 MG PO TB24
25.0000 mg | ORAL_TABLET | Freq: Every day | ORAL | Status: DC
  Administered 2020-05-09: 25 mg via ORAL
  Filled 2020-05-08: qty 1

## 2020-05-08 MED ORDER — ONDANSETRON HCL 4 MG/2ML IJ SOLN: 4.0000 mg | Freq: Four times a day (QID) | INTRAMUSCULAR | Status: DC | PRN

## 2020-05-08 MED ORDER — FENTANYL CITRATE (PF) 100 MCG/2ML IJ SOLN
INTRAMUSCULAR | Status: DC | PRN
  Administered 2020-05-08 (×3): 50 ug via INTRAVENOUS

## 2020-05-08 MED ORDER — LEVOTHYROXINE SODIUM 100 MCG PO TABS
100.0000 ug | ORAL_TABLET | Freq: Every day | ORAL | Status: DC
  Administered 2020-05-09: 100 ug via ORAL
  Filled 2020-05-08: qty 1

## 2020-05-08 MED ORDER — SODIUM CHLORIDE 0.9 % WEIGHT BASED INFUSION
3.0000 mL/kg/h | INTRAVENOUS | Status: DC
  Administered 2020-05-08: 3 mL/kg/h via INTRAVENOUS

## 2020-05-08 MED ORDER — CLOPIDOGREL BISULFATE 300 MG PO TABS
ORAL_TABLET | ORAL | Status: DC | PRN
  Administered 2020-05-08: 600 mg via ORAL

## 2020-05-08 MED ORDER — HEPARIN SODIUM (PORCINE) 1000 UNIT/ML IJ SOLN
INTRAMUSCULAR | Status: AC
  Filled 2020-05-08: qty 1

## 2020-05-08 MED ORDER — NITROGLYCERIN 1 MG/10 ML FOR IR/CATH LAB
INTRA_ARTERIAL | Status: DC | PRN
  Administered 2020-05-08: 100 ug via INTRACORONARY

## 2020-05-08 MED ORDER — NITROGLYCERIN 0.4 MG SL SUBL: 0.4000 mg | SUBLINGUAL_TABLET | SUBLINGUAL | Status: DC | PRN

## 2020-05-08 MED ORDER — HEPARIN (PORCINE) IN NACL 1000-0.9 UT/500ML-% IV SOLN
INTRAVENOUS | Status: DC | PRN
  Administered 2020-05-08 (×2): 500 mL

## 2020-05-08 MED ORDER — CLOPIDOGREL BISULFATE 300 MG PO TABS
ORAL_TABLET | ORAL | Status: AC
  Filled 2020-05-08: qty 2

## 2020-05-08 SURGICAL SUPPLY — 22 items
BALLN SAPPHIRE 2.5X15 (BALLOONS) ×2
BALLN WOLVERINE 2.75X10 (BALLOONS) ×2
BALLOON SAPPHIRE 2.5X15 (BALLOONS) ×1 IMPLANT
BALLOON WOLVERINE 2.75X10 (BALLOONS) ×1 IMPLANT
CATH INFINITI 5 FR JL3.5 (CATHETERS) ×2 IMPLANT
CATH INFINITI 5FR ANG PIGTAIL (CATHETERS) ×2 IMPLANT
CATH LAUNCHER 6FR EBU3.5 (CATHETERS) ×2 IMPLANT
CATH OPTICROSS HD (CATHETERS) ×2 IMPLANT
CATH OPTITORQUE TIG 4.0 5F (CATHETERS) ×2 IMPLANT
DEVICE RAD COMP TR BAND LRG (VASCULAR PRODUCTS) ×2 IMPLANT
GLIDESHEATH SLEND A-KIT 6F 22G (SHEATH) ×2 IMPLANT
GUIDEWIRE INQWIRE 1.5J.035X260 (WIRE) ×1 IMPLANT
INQWIRE 1.5J .035X260CM (WIRE) ×2
KIT ENCORE 26 ADVANTAGE (KITS) ×2 IMPLANT
KIT HEART LEFT (KITS) ×2 IMPLANT
KIT HEMO VALVE WATCHDOG (MISCELLANEOUS) ×2 IMPLANT
PACK CARDIAC CATHETERIZATION (CUSTOM PROCEDURE TRAY) ×2 IMPLANT
SLED PULL BACK IVUS (MISCELLANEOUS) ×2 IMPLANT
TRANSDUCER W/STOPCOCK (MISCELLANEOUS) ×2 IMPLANT
TUBING CIL FLEX 10 FLL-RA (TUBING) ×2 IMPLANT
WIRE ASAHI PROWATER 180CM (WIRE) ×2 IMPLANT
WIRE COUGAR XT STRL 190CM (WIRE) ×2 IMPLANT

## 2020-05-08 NOTE — H&P (Signed)
OV 05/08/2020 copied for documentation, see below.   Given her high pre-test probability for CAD, and high chances of artifacts on noninvasive testing-given obesity-cardiac catheterization was recommended by Dr. Terri Skains.   Nigel Mormon, MD Pager: [phone] Office: [phone]  Date:  05/08/2020   ID:  Krista Jimenez, DOB [birth year], MRN [id number]  PCP:  Pieter Partridge, PA  Cardiologist:  Nigel Mormon, DO, FACC (established care 04/23/2020)  REASON FOR CONSULT: Chest pain and Q waves suggestive of previous MI.   REQUESTING PHYSICIAN:  No referring provider defined for this encounter.  No chief complaint on file.   HPI  Krista Jimenez is a 69 y.o. female who presents to the office with a chief complaint of " chest pain." She is referred to the office at the request of No ref. provider found. Patient's past medical history and cardiovascular risk factors include: hypertension, hyperlipidemia, hypothyroidism, smoking cigarette 1ppd, obesity due to excess calorie, postmenopausal female, advanced age.  Patient is referred to the office at the request of her primary care provider for evaluation of chest pain and EKG finding of an old myocardial infarction.  Patient is accompanied by her granddaughter Krista Jimenez at today's office visit.  Patient states that she recently went to her yearly physical and had an EKG performed and was told that she has had old heart attack.  Patient was not aware of this finding and was asked to see cardiology because she was also having chest pain.  Patient states that she is been having chest discomfort for the last 1 year predominantly after eating and at times substernal discomfort.  She feels that is pressure-like/ache-like sensation, 4 out of 10 in intensity, usually last for a few minutes but can be up to 30 minutes at a time.  She has been attributing this to indigestion.  She takes aspirin and the symptoms do improve.  At times she also rubs  Vicks VapoRub and her symptoms improved.  No improving or worsening factors.  Patient is not physically active on a daily basis to comment if the symptoms are brought on by effort related activities.  But the symptoms do resolve with resting.  Patient states her last episode was yesterday night.  No active chest pain at the time of evaluation.  No family history of premature coronary disease or sudden cardiac death.  Denies prior history of coronary artery disease, congestive heart failure, deep venous thrombosis, pulmonary embolism, stroke, transient ischemic attack.  FUNCTIONAL STATUS: No structured exercise program or daily routine.   ALLERGIES: Allergies  Allergen Reactions   Penicillins Other (See Comments)    Childhood reaction; reaction unknown   Latex Rash    MEDICATION LIST PRIOR TO VISIT: Current Meds  Medication Sig   aspirin 81 MG tablet Take 81 mg by mouth daily.   levothyroxine (SYNTHROID) 100 MCG tablet Take 100 mcg by mouth daily before breakfast.    losartan (COZAAR) 50 MG tablet Take 50 mg by mouth daily.    meloxicam (MOBIC) 15 MG tablet Take 15 mg by mouth daily.    metoprolol succinate (TOPROL XL) 25 MG 24 hr tablet Take 1 tablet (25 mg total) by mouth daily.   mirabegron ER (MYRBETRIQ) 50 MG TB24 tablet Take 50 mg by mouth daily.    naproxen sodium (ALEVE) 220 MG tablet Take 220 mg by mouth daily as needed (headaches).   nitroGLYCERIN (NITROSTAT) 0.4 MG SL tablet Place 1 tablet (0.4 mg total) under the tongue every 5 (five)  minutes as needed for chest pain. If you require more than two tablets five minutes apart go to the nearest ER via EMS.   rosuvastatin (CRESTOR) 10 MG tablet Take 10 mg by mouth daily.      PAST MEDICAL HISTORY: Past Medical History:  Diagnosis Date   Hiatal hernia    Hyperlipidemia    Hypertension    Ovarian cyst    Spastic colon     PAST SURGICAL HISTORY: Past Surgical History:  Procedure Laterality Date    CESAREAN SECTION  1979, 1985   DILATION AND CURETTAGE OF UTERUS     HYSTEROSCOPY WITH D & C  12/17/2011   Procedure: DILATATION AND CURETTAGE /HYSTEROSCOPY;  Surgeon: Osborne Oman, MD;  Location: [facility];  Service: Gynecology;  Laterality: N/A;   LAPAROSCOPY     removal of ectopic preg   TUBAL LIGATION  1985    FAMILY HISTORY: The patient family history includes Aneurysm in her mother; Cancer in her brother and father; Heart disease in her sister; Hypertension in her brother, brother, brother, brother, father, and mother; Stroke in her father and mother.  SOCIAL HISTORY:  The patient  reports that she has been smoking cigarettes. She has a 39.00 pack-year smoking history. She has never used smokeless tobacco. She reports current alcohol use. She reports that she does not use drugs.  REVIEW OF SYSTEMS: Review of Systems  Constitutional: Negative for chills and fever.  HENT: Negative for hoarse voice and nosebleeds.   Eyes: Negative for discharge, double vision and pain.  Cardiovascular: Positive for chest pain and dyspnea on exertion. Negative for claudication, leg swelling, near-syncope, orthopnea, palpitations, paroxysmal nocturnal dyspnea and syncope.  Respiratory: Negative for hemoptysis and shortness of breath.   Musculoskeletal: Negative for muscle cramps and myalgias.  Gastrointestinal: Negative for abdominal pain, constipation, diarrhea, hematemesis, hematochezia, melena, nausea and vomiting.  Neurological: Negative for dizziness and light-headedness.   PHYSICAL EXAM: Vitals with BMI 05/08/2020 04/23/2020 10/08/2016  Height _0  _1  -  Weight 225 lbs 239 lbs -  BMI 98.92 11.9 -  Systolic 417 408 144  Diastolic 65 55 88  Pulse 76 83 70    CONSTITUTIONAL: Well-developed and well-nourished. No acute distress.  SKIN: Skin is warm and dry. No rash noted. No cyanosis. No pallor. No jaundice HEAD: Normocephalic and atraumatic.  EYES: No scleral icterus MOUTH/THROAT:  Moist oral membranes.  NECK: No JVD present. No thyromegaly noted. No carotid bruits  LYMPHATIC: No visible cervical adenopathy.  CHEST Normal respiratory effort. No intercostal retractions  LUNGS: Clear to auscultation bilaterally.  No stridor. No wheezes. No rales.  CARDIOVASCULAR: Regular rate and rhythm, positive Y1-E5, soft holosystolic murmur, no gallops or rubs. ABDOMINAL: Obese, soft, nontender, nondistended, positive bowel sounds in all 4 quadrants no apparent ascites.  EXTREMITIES: No peripheral edema  HEMATOLOGIC: No significant bruising NEUROLOGIC: Oriented to person, place, and time. Nonfocal. Normal muscle tone.  PSYCHIATRIC: Normal mood and affect. Normal behavior. Cooperative  CARDIAC DATABASE: EKG: 04/23/2020:Sinus  Tachycardia, 101bpm, PRWP, left axis, LAFB, old inferior infarct.   Echocardiogram: None  Stress Testing: None  Heart Catheterization: None  LABORATORY DATA: External Labs: Collected: February 23, 2020 Creatinine 0.69 mg/dL. eGFR: 88 mL/min per 1.73 m Lipid profile: Total cholesterol 102, triglycerides 92, HDL 34, LDL 58, non-HDL 68 TSH: 31.4 Hemoglobin A1c: 6  IMPRESSION:  No diagnosis found.   RECOMMENDATIONS: ARLAYNE LIGGINS is a 69 y.o. female whose past medical history and cardiac risk factors include:  hypertension,  hyperlipidemia, hypothyroidism, smoking cigarette 1ppd, obesity due to excess calorie, postmenopausal female, advanced age.  Angina pectoris:  Patient symptoms of chest discomfort appears to be anginal equivalent.  She has multiple cardiovascular risk factors as noted above.  Her pretest probability for underlying obstructive coronary artery disease is high and therefore invasive angiography with possible intervention was recommended.  Patient is agreeable to proceed with left heart catheterization with possible intervention.  The left heart catheterization procedure was explained to the patient and her granddaughter in detail.  The indication, alternatives, risks and benefits were reviewed. Complications including but not limited to bleeding, infection, acute kidney injury, blood transfusion, heart rhythm disturbances, contrast (dye) reaction, damage to the arteries or nerves in the legs or hands, cerebrovascular accident, myocardial infarction, need for emergent bypass surgery, blood clots in the legs, possible need for emergent blood transfusion, and rarely death were reviewed and discussed with the patient and her granddaughter. The patient and her granddaughter voices understanding and wishes to proceed.   Continue aspirin.  We will start Toprol-XL 25 mg p.o. every morning.  Continue statin therapy.  Prescribed sublingual nitroglycerin tablets to use on as needed basis.  Medication profile discussed.  Patient is asked to seek medical attention sooner at the closest hospital via EMS if her symptoms were to increase in intensity, frequency, duration, or has typical chest discomfort.  Both the patient and her granddaughter verbalized understanding.  Check BMP and CBC.  Screen for COVID-19 prior to elective left heart catheterization.  Benign essential hypertension: Currently being managed by PCP.  Medications reconciled.  Mixed hyperlipidemia:  Continue statin therapy.    Follow lipids.  Currently managed by primary care provider.  Patient denies myalgia or other side effects.  Active tobacco use: Educated on importance of complete smoking cessation.  Independently reviewed outside labs from [facility] from care everywhere, independently reviewed the EKG from PCPs office.  Complex decision making in regards to management of angina pectoris as discussed above.  FINAL MEDICATION LIST END OF ENCOUNTER: No orders of the defined types were placed in this encounter.   There are no discontinued medications.  No current facility-administered medications for this encounter.  No orders of the defined types  were placed in this encounter.   There are no outpatient Patient Instructions on file for this admission.   --Continue cardiac medications as reconciled in final medication list. --No follow-ups on file. Or sooner if needed. --Continue follow-up with your primary care physician regarding the management of your other chronic comorbid conditions.  Patient's questions and concerns were addressed to her satisfaction. She voices understanding of the instructions provided during this encounter.   This note was created using a voice recognition software as a result there may be grammatical errors inadvertently enclosed that do not reflect the nature of this encounter. Every attempt is made to correct such errors.  Rex Kras, [place], [facility]  Pager: [phone] Office: [phone]

## 2020-05-08 NOTE — Interval H&P Note (Signed)
History and Physical Interval Note:  05/08/2020 2:49 PM  Krista Jimenez  has presented today for surgery, with the diagnosis of angina - abnormal ekg.  The various methods of treatment have been discussed with the patient and family. After consideration of risks, benefits and other options for treatment, the patient has consented to  Procedure(s): LEFT HEART CATH AND CORONARY ANGIOGRAPHY (N/A) as a surgical intervention.  The patient's history has been reviewed, patient examined, no change in status, stable for surgery.  I have reviewed the patient's chart and labs.  Questions were answered to the patient's satisfaction.    Given her high pre-test probability for CAD, and high chances of artifacts on noninvasive testing-given obesity-cardiac catheterization was recommended by Dr. Terri Skains.  Unstable angina, stabilized patient at Intermediate Risk (TIMI Score 3-4) (Nitrate responsive chest pain at rest)  Link Here: [email or web address]/ Indication:  Revascularization by PCI or CABG of 1 or more arteries in a patient with NSTEMI or unstable angina with Stabilization after presentation Intermediate risk for clinical events  A (7) Indication: 16; Score 7   [place]

## 2020-05-09 ENCOUNTER — Encounter (HOSPITAL_COMMUNITY): Payer: Self-pay | Admitting: Cardiology

## 2020-05-09 DIAGNOSIS — I10 Essential (primary) hypertension: Secondary | ICD-10-CM | POA: Diagnosis not present

## 2020-05-09 DIAGNOSIS — E039 Hypothyroidism, unspecified: Secondary | ICD-10-CM | POA: Diagnosis not present

## 2020-05-09 DIAGNOSIS — E785 Hyperlipidemia, unspecified: Secondary | ICD-10-CM | POA: Diagnosis not present

## 2020-05-09 DIAGNOSIS — I25110 Atherosclerotic heart disease of native coronary artery with unstable angina pectoris: Secondary | ICD-10-CM | POA: Diagnosis not present

## 2020-05-09 LAB — CBC
HCT: 38.6 % (ref 36.0–46.0)
Hemoglobin: 12.3 g/dL (ref 12.0–15.0)
MCH: 29 pg (ref 26.0–34.0)
MCHC: 31.9 g/dL (ref 30.0–36.0)
MCV: 91 fL (ref 80.0–100.0)
Platelets: 236 10*3/uL (ref 150–400)
RBC: 4.24 MIL/uL (ref 3.87–5.11)
RDW: 13.2 % (ref 11.5–15.5)
WBC: 7.8 10*3/uL (ref 4.0–10.5)
nRBC: 0 % (ref 0.0–0.2)

## 2020-05-09 LAB — BASIC METABOLIC PANEL
Anion gap: 11 (ref 5–15)
BUN: 10 mg/dL (ref 8–23)
CO2: 22 mmol/L (ref 22–32)
Calcium: 9.3 mg/dL (ref 8.9–10.3)
Chloride: 108 mmol/L (ref 98–111)
Creatinine, Ser: 0.72 mg/dL (ref 0.44–1.00)
GFR calc Af Amer: >60 mL/min (ref >60)
GFR calc non Af Amer: >60 mL/min (ref >60)
Glucose, Bld: 90 mg/dL (ref 70–99)
Potassium: 3.5 mmol/L (ref 3.5–5.1)
Sodium: 141 mmol/L (ref 135–145)

## 2020-05-09 MED ORDER — CLOPIDOGREL BISULFATE 75 MG PO TABS
75.0000 mg | ORAL_TABLET | Freq: Every day | ORAL | 2 refills | Status: DC
Start: 2020-05-09 — End: 2020-05-11

## 2020-05-09 MED ORDER — ISOSORBIDE MONONITRATE ER 30 MG PO TB24
30.0000 mg | ORAL_TABLET | Freq: Every day | ORAL | 2 refills | Status: DC
Start: 2020-05-09 — End: 2020-05-11

## 2020-05-09 MED ORDER — ACETAMINOPHEN 325 MG PO TABS: 650.0000 mg | ORAL_TABLET | ORAL | 2 refills | Status: AC | PRN

## 2020-05-09 MED FILL — ISOSORBIDE MN ER 30 MG TAB: 30 | 30 days supply | Qty: 30 | Fill #0

## 2020-05-09 MED FILL — CLOPIDOGREL 75 MG TABLET: 75 | 30 days supply | Qty: 30 | Fill #0

## 2020-05-09 NOTE — Plan of Care (Signed)
Pt discharged to home with self care, d/c instructions given, with printed and verbal education presented, reinforced new medications, follow up appointments, self care and reasons to return to hospital, verbal understanding received.

## 2020-05-09 NOTE — Progress Notes (Signed)
CARDIAC REHAB PHASE I   PRE:  Rate/Rhythm: 74 SR  BP:  Supine:   Sitting: 145/57  Standing:    SaO2: 94%RA  MODE:  Ambulation: 100 ft   POST:  Rate/Rhythm: 100 SR  BP:  Supine:   Sitting: 118/57  Standing:    SaO2: 96%RA 0900-1000 Pt walked 100 ft on RA with slow steady gait. Stated she does not walk far due to knee issues which limit distance. No CP. Discussed with pt the importance of plavix. Reviewed NTG use, heart healthy food choices, smoking cessation and CRP 2. Gave smoking cessation handout. Pt stated going to quit cold [place]. Pt stated she will see cardiologist on 9/3 to address her coming back and attempting stent. She stated she felt she could tolerate then. Put in order for CRP 2 [place] since she did have PTCA with 10% improvement( In case she decides against future PCI and to meet protocol).    Graylon Good, RN BSN  05/09/2020 9:56 AM

## 2020-05-09 NOTE — Discharge Summary (Signed)
Physician Discharge Summary  Patient ID: Krista Jimenez MRN: [id number] DOB/AGE: [birth year] 71 y.o.  Admit date: 05/08/2020 Discharge date: 05/09/2020  Primary Discharge Diagnosis: Coronary artery disease  Secondary Discharge Diagnosis: Hypertension Tobacco dependence    Hospital Course:   69 y/o Caucasian female with hypertension, obesity, tobacco dependence, severe two vessel CAD.  Coronary angiogram details below. She continues to have angina symptoms on current medica therapy. Unable to perform intervention beyond PTCA on 05/08/20 due to back pain and generalized pain issues limiting her co-operation.   I had a long discussion with the patient today regarding management with medical therapy + revascularization. Syntax score 18, therefore PCI and CABG are comparable. Patient is still unsure.  I will see her for follow up on 9/3 to further discuss this. Discharged on DAPT (Aspirin + plavix), plus Imdur, metoprolol, statin.  Discharge Exam: Blood pressure (!) 121/55, pulse 71, temperature 97.8 F (36.6 C), temperature source Oral, resp. rate 18, height 5\' 2"  (1.575 m), weight 104.6 kg, SpO2 96 %.   Physical Exam Vitals and nursing note reviewed.  Constitutional:      General: She is not in acute distress.    Appearance: She is well-developed.  HENT:     Head: Normocephalic and atraumatic.  Eyes:     Conjunctiva/sclera: Conjunctivae normal.     Pupils: Pupils are equal, round, and reactive to light.  Neck:     Vascular: No JVD.  Cardiovascular:     Rate and Rhythm: Normal rate and regular rhythm.     Pulses: Normal pulses and intact distal pulses.     Heart sounds: No murmur heard.   Pulmonary:     Effort: Pulmonary effort is normal.     Breath sounds: Normal breath sounds. No wheezing or rales.  Abdominal:     General: Bowel sounds are normal.     Palpations: Abdomen is soft.     Tenderness: There is no rebound.  Musculoskeletal:        General: No tenderness.  Normal range of motion.     Left lower leg: No edema.  Lymphadenopathy:     Cervical: No cervical adenopathy.  Skin:    General: Skin is warm and dry.  Neurological:     Mental Status: She is alert and oriented to person, place, and time.     Cranial Nerves: No cranial nerve deficit.     Significant Diagnostic Studies:  EKG 04/23/2020: Sinus  Tachycardia, 101bpm, PRWP, left axis, LAFB, old inferior infarct.   Coronary angiography 05/08/2020: LM: Normal LAD: Prox 80% stenosis        Distal 40% disease LCx: Dominant vessel        Medium sized OM1 with mid 90% and proximal 70% stenosis, extending into main LCx proximal to origin of AV grove LCx        Large AV grove LCx with mid diffuse 50% disease. Vessel free of disease at ostium of OM1 RCA: Small nondominant  LVEDP 23 mmHg LVEF 45-50%  Patient was extremely unstable on cath table, largely owing to back and generalized pain. I had already used moderate amount of sedation. I did not think it was safe to continue given complexity of the intervention and uncooperative patient without risking harm. Thus, I decided to abort the procedure.  Will continue medical management for now, including addition of long acting nitrate. I will discuss with the patient regarding repeat attempt.   Labs:   Lab Results  Component Value Date  WBC 7.8 05/09/2020   HGB 12.3 05/09/2020   HCT 38.6 05/09/2020   MCV 91.0 05/09/2020   PLT 236 05/09/2020    Recent Labs  Lab 05/09/20 0636  NA 141  K 3.5  CL 108  CO2 22  BUN 10  CREATININE 0.72  CALCIUM 9.3  GLUCOSE 90    Radiology: CARDIAC CATHETERIZATION  Result Date: 05/08/2020 LM: Normal LAD: Prox 80% stenosis        Distal 40% disease LCx: Dominant vessel        Medium sized OM1 with mid 90% and proximal 70% stenosis, extending into main LCx proximal to origin of AV grove LCx        Large AV grove LCx with mid diffuse 50% disease. Vessel free of disease at ostium of OM1 RCA: Small  nondominant LVEDP 23 mmHg Patient was extremely unstable on cath table, largely owing to back and generalized pain. I had already used moderate amount of sedation. I did not think it was safe to continue given complexity of the intervention and uncooperative patient without risking harm. Thus, I decided to abort the procedure. Will continue medical management for now, including addition of long acting nitrate. I will discuss with the patient regarding repeat attempt. Nigel Mormon, MD Pager: [phone] Office: [phone]      FOLLOW UP PLANS AND APPOINTMENTS Discharge Instructions    Amb Referral to Cardiac Rehabilitation   Complete by: As directed    Diagnosis: PTCA   After initial evaluation and assessments completed: Virtual Based Care may be provided alone or in conjunction with Phase 2 Cardiac Rehab based on patient barriers.: Yes   Diet - low sodium heart healthy   Complete by: As directed    Increase activity slowly   Complete by: As directed      Allergies as of 05/09/2020      Reactions   Penicillins Other (See Comments)   Childhood reaction; reaction unknown   Latex Rash      Medication List    STOP taking these medications   meloxicam 15 MG tablet Commonly known as: MOBIC   naproxen sodium 220 MG tablet Commonly known as: ALEVE     TAKE these medications   acetaminophen 325 MG tablet Commonly known as: TYLENOL Take 2 tablets (650 mg total) by mouth every 4 (four) hours as needed for headache or mild pain.   aspirin 81 MG tablet Take 81 mg by mouth daily.   clopidogrel 75 MG tablet Commonly known as: PLAVIX Take 1 tablet (75 mg total) by mouth daily with breakfast.   isosorbide mononitrate 30 MG 24 hr tablet Commonly known as: IMDUR Take 1 tablet (30 mg total) by mouth daily.   levothyroxine 100 MCG tablet Commonly known as: SYNTHROID Take 100 mcg by mouth daily before breakfast.   losartan 50 MG tablet Commonly known as: COZAAR Take 50 mg by  mouth daily.   metoprolol succinate 25 MG 24 hr tablet Commonly known as: Toprol XL Take 1 tablet (25 mg total) by mouth daily.   Myrbetriq 50 MG Tb24 tablet Generic drug: mirabegron ER Take 50 mg by mouth daily.   nitroGLYCERIN 0.4 MG SL tablet Commonly known as: Nitrostat Place 1 tablet (0.4 mg total) under the tongue every 5 (five) minutes as needed for chest pain. If you require more than two tablets five minutes apart go to the nearest ER via EMS.   rosuvastatin 10 MG tablet Commonly known as: CRESTOR Take 10 mg by mouth  daily.       Follow-up Information    Nigel Mormon, MD Follow up on 05/11/2020.   Specialties: Cardiology, Radiology Why: 11:45 AM Contact information: [place] [address] [phone]                 Nigel Mormon, MD Pager: [phone] Office: [phone]

## 2020-05-11 ENCOUNTER — Other Ambulatory Visit: Payer: Self-pay

## 2020-05-11 ENCOUNTER — Ambulatory Visit: Payer: Medicare HMO | Admitting: Cardiology

## 2020-05-11 ENCOUNTER — Encounter: Payer: Self-pay | Admitting: Cardiology

## 2020-05-11 VITALS — BP 146/74 | HR 70 | Resp 16 | Ht 62.0 in | Wt 232.0 lb

## 2020-05-11 DIAGNOSIS — I209 Angina pectoris, unspecified: Secondary | ICD-10-CM

## 2020-05-11 DIAGNOSIS — I10 Essential (primary) hypertension: Secondary | ICD-10-CM

## 2020-05-11 DIAGNOSIS — I25118 Atherosclerotic heart disease of native coronary artery with other forms of angina pectoris: Secondary | ICD-10-CM

## 2020-05-11 MED ORDER — ISOSORBIDE MONONITRATE ER 30 MG PO TB24: 30.0000 mg | ORAL_TABLET | Freq: Every day | ORAL | 2 refills | Status: DC

## 2020-05-11 MED ORDER — METOPROLOL SUCCINATE ER 25 MG PO TB24: 25.0000 mg | ORAL_TABLET | Freq: Every day | ORAL | 0 refills | Status: DC

## 2020-05-11 MED ORDER — NITROGLYCERIN 0.4 MG SL SUBL: 0.4000 mg | SUBLINGUAL_TABLET | SUBLINGUAL | 2 refills | Status: AC | PRN

## 2020-05-11 MED ORDER — CLOPIDOGREL BISULFATE 75 MG PO TABS: 75.0000 mg | ORAL_TABLET | Freq: Every day | ORAL | 2 refills | Status: DC

## 2020-05-11 MED ORDER — ROSUVASTATIN CALCIUM 20 MG PO TABS
10.0000 mg | ORAL_TABLET | Freq: Every day | ORAL | 2 refills | Status: AC
Start: 2020-05-11 — End: ?

## 2020-05-11 MED ORDER — ASPIRIN EC 81 MG PO TBEC: 81.0000 mg | DELAYED_RELEASE_TABLET | Freq: Every day | ORAL | 3 refills | Status: DC

## 2020-05-11 MED ORDER — LOSARTAN POTASSIUM 50 MG PO TABS: 50.0000 mg | ORAL_TABLET | Freq: Every day | ORAL | 2 refills | Status: AC

## 2020-05-11 NOTE — Progress Notes (Signed)
Follow up visit  Subjective:   Krista Jimenez, female    DOB: 1951-07-14, 69 y.o.   MRN: [id number]   HPI  Chief Complaint  Patient presents with   Follow-up   Cath F/U    69 y.o. Caucasian female with hypertension, hyperlipidemia, former smoker CAD.  Patient was initially seen by my partner Dr. Terri Skains. Given her worsening angina symptoms for over a year, she was recommended coronary angiography.   Coronary angiography showed severe LCx/OM and prox LAD disease. I attempted intervention. However, Patient was extremely unstable on cath table, largely owing to back and generalized pain. I had already used moderate amount of sedation. I did not think it was safe to continue given complexity of the intervention and uncooperative patient without risking harm. Thus, I decided to abort the procedure. I continued medical management for now, including addition of long acting nitrate.   Patient is here today with her daughter Krista Jimenez. Patient's physical activity is extremely limited right now, primarily due to her back pain and exertional dyspnea. She has not had any recurrent chest pain. She remains unsure about undergoing coronary intervention. She says she gets "suffocated" wearing the mask, and her back hurts.     Current Outpatient Medications on File Prior to Visit  Medication Sig Dispense Refill   acetaminophen (TYLENOL) 325 MG tablet Take 2 tablets (650 mg total) by mouth every 4 (four) hours as needed for headache or mild pain. 30 tablet 2   aspirin 81 MG tablet Take 81 mg by mouth daily.     clopidogrel (PLAVIX) 75 MG tablet Take 1 tablet (75 mg total) by mouth daily with breakfast. 30 tablet 2   isosorbide mononitrate (IMDUR) 30 MG 24 hr tablet Take 1 tablet (30 mg total) by mouth daily. 30 tablet 2   levothyroxine (SYNTHROID) 100 MCG tablet Take 100 mcg by mouth daily before breakfast.      losartan (COZAAR) 50 MG tablet Take 50 mg by mouth daily.      metoprolol succinate  (TOPROL XL) 25 MG 24 hr tablet Take 1 tablet (25 mg total) by mouth daily. 90 tablet 0   mirabegron ER (MYRBETRIQ) 50 MG TB24 tablet Take 50 mg by mouth daily.      nitroGLYCERIN (NITROSTAT) 0.4 MG SL tablet Place 1 tablet (0.4 mg total) under the tongue every 5 (five) minutes as needed for chest pain. If you require more than two tablets five minutes apart go to the nearest ER via EMS. 30 tablet 0   rosuvastatin (CRESTOR) 10 MG tablet Take 10 mg by mouth daily.      No current facility-administered medications on file prior to visit.    Cardiovascular & other pertient studies:  EKG 05/11/2020: Sinus rhythm 70 bpm Poor R-wave progression Nonspecific T-abnormality  Recent labs: 05/11/2020: Glucose 90, BUN/Cr 10/0.72. EGFR >60. Na/K 141/3.5.  H/H 12/38. MCV 91. Platelets 236 HbA1C 6.0% Chol 102, TG 92, HDL 34, LDL 68 TSH ?   Review of Systems  Cardiovascular: Positive for chest pain (Occasional) and dyspnea on exertion. Negative for leg swelling, palpitations and syncope.  Respiratory: Positive for shortness of breath.   Musculoskeletal: Positive for back pain.         Vitals:   05/11/20 1123  BP: (!) 146/74  Pulse: 70  Resp: 16  SpO2: 95%    Body mass index is 42.43 kg/m. Filed Weights   05/11/20 1123  Weight: 232 lb (105.2 kg)     Objective:  Physical Exam Vitals and nursing note reviewed.  Constitutional:      General: She is not in acute distress. Neck:     Vascular: No JVD.  Cardiovascular:     Rate and Rhythm: Normal rate and regular rhythm.     Heart sounds: Normal heart sounds. No murmur heard.   Pulmonary:     Effort: Pulmonary effort is normal.     Breath sounds: Normal breath sounds. No wheezing or rales.           Assessment & Recommendations:   69 y.o. Caucasian female with hypertension, hyperlipidemia, former smoker CAD.  CAD: Severe prox LAD, OM1 and mod Prox Lcx disease. If symptoms persist in spite of optimal medical therapy,  she will likely need complex coronary intervention, including possibility of bifurcation stenting to LCx/OM1. Her Syntax score is 18, thus making PCI still the optimal option for revascularization. However, her symptoms of chest pain-with her limited physical activity-are minimal. She does have exertional dyspnea, which could be angina equivalent. However, I would recommended PFT evaluation. Moreover, I am not convinced that she will be co-operate with a complex coronary intervention, owing to her back pain and diffculty wearing a mask.  I explained to her that PCI for stable angina improves symptoms, but does not reduce risk of MI. At this time, I recommend continued medical management Given recent attempted PTCA, recommend DAPT with Aspirin, plavix. I have counseled her regarding reducing NSAID use. Continue metoprolol Imdur. Increased Crestor to 20 mg.  I will see her back in 4 weeks for follow up. If symptoms worsen, and if patient amenable, could consider PCI at that time.  Time spent:  45 min    Nigel Mormon, MD Pager: [phone] Office: [phone]

## 2020-05-11 NOTE — Patient Instructions (Signed)
Coronary Artery Disease, Female Coronary artery disease (CAD) is a condition in which the arteries that lead to the heart (coronary arteries) become narrow or blocked. The narrowing or blockage can lead to decreased blood flow to the heart. Prolonged reduced blood flow can cause a heart attack (myocardial infarction or MI). This condition may also be called coronary heart disease. Because CAD is the leading cause of death in women, it is important to understand what causes this condition and how it is treated. What are the causes? CAD is most often caused by atherosclerosis. This is the buildup of fat and cholesterol (plaque) on the inside of the arteries. Over time, the plaque may narrow or block the artery, reducing blood flow to the heart. Plaque can also become weak and break off within a coronary artery and cause a sudden blockage. Other less common causes of CAD include:  A blood clot or a piece of a blood clot or other substance that blocks the flow of blood in a coronary artery (embolism).  A tearing of the artery (spontaneous coronary artery dissection).  An enlargement of an artery (aneurysm).  Inflammation (vasculitis) in the artery wall. What increases the risk? The following factors may make you more likely to develop this condition:  Age. Women over age 66 are at a greater risk of CAD.  Family history of CAD.  High blood pressure (hypertension).  Diabetes.  High cholesterol levels.  Tobacco use.  Lack of exercise.  Menopause. ? All postmenopausal women are at greater risk of CAD. ? Women who have experienced menopause between the ages of 64-45 (early menopause) are at a higher risk of CAD. ? Women who have experienced menopause before age 77 (premature menopause) are at a very high risk of CAD.  Excessive alcohol use.  A diet high in saturated and trans fats, such as fried food and processed meat. Other possible risk factors include:  High stress  levels.  Depression.  Obesity.  Sleep apnea. What are the signs or symptoms? Many people do not have any symptoms during the early stages of CAD. As the condition progresses, symptoms may include:  Chest pain (angina). The pain can: ? Feel like crushing or squeezing, or like a tightness, pressure, fullness, or heaviness in the chest. ? Last more than a few minutes or can stop and recur. The pain tends to get worse with exercise or stress and to fade with rest.  Pain in the arms, neck, jaw, ear, or back.  Unexplained heartburn or indigestion.  Shortness of breath.  Nausea.  Sudden cold sweats.  Sudden light-headedness.  Fluttering or fast heartbeat (palpitations). Many women have chest discomfort and the other symptoms. However, women often have unusual (atypical) symptoms, such as:  Fatigue.  Vomiting.  Unexplained feelings of nervousness or anxiety.  Unexplained weakness.  Dizziness or fainting. How is this diagnosed? This condition is diagnosed based on:  Your family and medical history.  A physical exam.  Tests, including: ? A test to check the electrical signals in your heart (electrocardiogram). ? Exercise stress test. This looks for signs of blockage when the heart is stressed with exercise, such as running on a treadmill. ? Pharmacologic stress test. This test looks for signs of blockage when the heart is being stressed with a medicine. ? Blood tests. ? Coronary angiogram. This is a procedure to look at the coronary arteries to see if there is any blockage. During this test, a dye is injected into your arteries so they  appear on an X-ray. ? Coronary artery CT scan. This CT scan helps detect calcium deposits in your coronary arteries. Calcium deposits are an indicator of CAD. ? A test that uses sound waves to take a picture of your heart (echocardiogram). ? Chest X-ray. How is this treated? This condition may be treated by:  Healthy lifestyle changes to  reduce risk factors.  Medicines such as: ? Antiplatelet medicines and blood-thinning medicines, such as aspirin. These help to prevent blood clots. ? Nitroglycerin. ? Blood pressure medicines. ? Cholesterol-lowering medicine.  Coronary angioplasty and stenting. During this procedure, a thin, flexible tube is inserted through a blood vessel and into a blocked artery. A balloon or similar device on the end of the tube is inflated to open up the artery. In some cases, a small, mesh tube (stent) is inserted into the artery to keep it open.  Coronary artery bypass surgery. During this surgery, veins or arteries from other parts of the body are used to create a bypass around the blockage and allow blood to reach your heart. Follow these instructions at home: Medicines  Take over-the-counter and prescription medicines only as told by your health care provider.  Do not take the following medicines unless your health care provider approves: ? NSAIDs, such as ibuprofen, naproxen, or celecoxib. ? Vitamin supplements that contain vitamin A, vitamin E, or both. ? Hormone replacement therapy that contains estrogen with or without progestin. Lifestyle  Follow an exercise program approved by your health care provider. Aim for 150 minutes of moderate exercise or 75 minutes of vigorous exercise each week.  Maintain a healthy weight or lose weight as approved by your health care provider.  Learn to manage stress or try to limit your stress. Ask your health care provider for suggestions if you need help.  Get screened for depression and seek treatment, if needed.  Do not use any products that contain nicotine or tobacco, such as cigarettes, e-cigarettes, and chewing tobacco. If you need help quitting, ask your health care provider.  Do not use illegal drugs. Eating and drinking   Follow a heart-healthy diet. A dietitian can help educate you about healthy food options and changes. In general, eat  plenty of fruits and vegetables, lean meats, and whole grains.  Avoid foods high in: ? Sugar. ? Salt (sodium). ? Saturated fats, such as processed or fatty meat. ? Trans fats, such as fried food.  Use healthy cooking methods such as roasting, grilling, broiling, baking, poaching, steaming, or stir-frying.  Do not drink alcohol if: ? Your health care provider tells you not to drink. ? You are pregnant, may be pregnant, or are planning to become pregnant.  If you drink alcohol: ? Limit how much you have to 0-1 drink a day. ? Be aware of how much alcohol is in your drink. In the [place] one drink equals one 12 oz bottle of beer (355 mL), one 5 oz glass of wine (148 mL), or one 1 oz glass of hard liquor (44 mL). General instructions  Manage any other health conditions, such as hypertension and diabetes. These conditions affect your heart.  Your health care provider may ask you to monitor your blood pressure. Ideally, your blood pressure should be below 130/80.  Keep all follow-up visits as told by your health care provider. This is important. Get help right away if:  You have pain in your chest, neck, ear, arm, jaw, stomach, or back that: ? Lasts more than a few minutes. ?  Is recurring. ? Is not relieved by taking medicine under your tongue (sublingual nitroglycerin).  You have profuse sweating without cause.  You have unexplained: ? Heartburn or indigestion. ? Shortness of breath or difficulty breathing. ? Fluttering or fast heartbeat (palpitations). ? Nausea or vomiting. ? Fatigue. ? Feelings of nervousness or anxiety. ? Weakness. ? Diarrhea.  You have sudden light-headedness or dizziness.  You faint.  You feel like hurting yourself or think about taking your own life. These symptoms may represent a serious problem that is an emergency. Do not wait to see if the symptoms will go away. Get medical help right away. Call your local emergency services (911 in the [place] Do  not drive yourself to the hospital. Summary  Coronary artery disease (CAD) is a condition in which the arteries that lead to the heart (coronary arteries) become narrow or blocked. The narrowing or blockage can lead to a heart attack.  Many women have chest discomfort and other common symptoms of CAD. However, women often have unusual (atypical) symptoms, such as fatigue, vomiting, weakness, or dizziness.  CAD can be treated with lifestyle changes, medicines, surgery, or a combination of these treatments. This information is not intended to replace advice given to you by your health care provider. Make sure you discuss any questions you have with your health care provider. Document Revised: 05/14/2018 Document Reviewed: 05/04/2018 Elsevier Patient Education  2020 [employer]  

## 2020-05-12 ENCOUNTER — Encounter: Payer: Self-pay | Admitting: Cardiology

## 2020-05-12 DIAGNOSIS — I10 Essential (primary) hypertension: Secondary | ICD-10-CM | POA: Insufficient documentation

## 2020-05-12 DIAGNOSIS — I25118 Atherosclerotic heart disease of native coronary artery with other forms of angina pectoris: Secondary | ICD-10-CM | POA: Insufficient documentation

## 2020-05-15 ENCOUNTER — Other Ambulatory Visit: Payer: Self-pay

## 2020-05-15 ENCOUNTER — Ambulatory Visit: Payer: Medicare HMO

## 2020-05-15 DIAGNOSIS — I209 Angina pectoris, unspecified: Secondary | ICD-10-CM

## 2020-05-15 SURGERY — CORONARY STENT INTERVENTION
Anesthesia: LOCAL

## 2020-05-21 ENCOUNTER — Ambulatory Visit: Payer: Medicare HMO | Admitting: Cardiology

## 2020-06-01 NOTE — Progress Notes (Signed)
Unable to contact pt. Left vm regarding upcoming appt.

## 2020-06-11 ENCOUNTER — Ambulatory Visit: Payer: Medicare HMO | Admitting: Cardiology

## 2020-06-11 ENCOUNTER — Encounter: Payer: Self-pay | Admitting: Cardiology

## 2020-06-11 ENCOUNTER — Other Ambulatory Visit: Payer: Self-pay

## 2020-06-11 DIAGNOSIS — I10 Essential (primary) hypertension: Secondary | ICD-10-CM

## 2020-06-11 DIAGNOSIS — I25118 Atherosclerotic heart disease of native coronary artery with other forms of angina pectoris: Secondary | ICD-10-CM

## 2020-06-11 MED ORDER — CLOPIDOGREL BISULFATE 75 MG PO TABS
75.0000 mg | ORAL_TABLET | Freq: Every day | ORAL | 2 refills | Status: AC
Start: 2020-06-11 — End: ?

## 2020-06-11 NOTE — Progress Notes (Signed)
Follow up visit  Subjective:   Krista Jimenez, female    DOB: 1951/08/16, 69 y.o.   MRN: [id number]   HPI  Chief Complaint  Patient presents with  . Hypertension  . Follow-up    4 week    69 y.o. Caucasian female with hypertension, hyperlipidemia, former smoker, CAD.  Patient is here for follow up on her CAD management. She is here with her granddaughter Fara Chute.   Patient has had no chest pain since her last visit with me.  She has 1 with her granddaughter recently without any significant chest pain.  She does have exertional dyspnea.  She has not had any prior evaluation for COPD.  To recap previous history, she does have severe two-vessel disease.  Previous attempt at percutaneous coronary intervention was aborted due to patient noncooperation owing to her back pain.  On detailed discussion in the past, we have mutually decided to continue medical management for what seems to be stable coronary artery disease, without any unstable features.   Current Outpatient Medications on File Prior to Visit  Medication Sig Dispense Refill  . acetaminophen (TYLENOL) 325 MG tablet Take 2 tablets (650 mg total) by mouth every 4 (four) hours as needed for headache or mild pain. 30 tablet 2  . aspirin EC 81 MG tablet Take 1 tablet (81 mg total) by mouth daily. Swallow whole. 90 tablet 3  . clopidogrel (PLAVIX) 75 MG tablet Take 1 tablet (75 mg total) by mouth daily with breakfast. 30 tablet 2  . isosorbide mononitrate (IMDUR) 30 MG 24 hr tablet Take 1 tablet (30 mg total) by mouth daily. 30 tablet 2  . levothyroxine (SYNTHROID) 100 MCG tablet Take 100 mcg by mouth daily before breakfast.     . losartan (COZAAR) 50 MG tablet Take 1 tablet (50 mg total) by mouth daily. 90 tablet 2  . meloxicam (MOBIC) 15 MG tablet Take 1 tablet by mouth daily.    . metoprolol succinate (TOPROL XL) 25 MG 24 hr tablet Take 1 tablet (25 mg total) by mouth daily. 90 tablet 0  . mirabegron ER (MYRBETRIQ) 50 MG TB24  tablet Take 50 mg by mouth daily.     . nitroGLYCERIN (NITROSTAT) 0.4 MG SL tablet Place 1 tablet (0.4 mg total) under the tongue every 5 (five) minutes as needed for chest pain. If you require more than two tablets five minutes apart go to the nearest ER via EMS. 30 tablet 2  . rosuvastatin (CRESTOR) 20 MG tablet Take 0.5 tablets (10 mg total) by mouth daily. 90 tablet 2   No current facility-administered medications on file prior to visit.    Cardiovascular & other pertient studies:  Echocardiogram 05/15/2020:  Left ventricle cavity is normal in size. Moderate asymmetric hypertrophy  of the left ventricle. Mild global hypokinesis. LVEF 45-50%. Doppler  evidence of grade I (impaired) diastolic dysfunction.  Mild (Grade I) mitral regurgitation.  Mild aortic regurgitation.  Inadequate TR jet to estimate pulmonary artery systolic pressure.  Estimated RA pressure 8 mmHg.  EKG 05/11/2020: Sinus rhythm 70 bpm Poor R-wave progression Nonspecific T-abnormality  Coronary angiogram 05/08/2020: LM: Normal LAD: Prox 80% stenosis        Distal 40% disease LCx: Dominant vessel        Medium sized OM1 with mid 90% and proximal 70% stenosis, extending into main LCx proximal to origin of AV grove LCx        Large AV grove LCx with mid diffuse 50% disease. Vessel  free of disease at ostium of OM1 RCA: Small nondominant  LVEDP 23 mmHg  Patient was extremely unstable on cath table, largely owing to back and generalized pain. I had already used moderate amount of sedation. I did not think it was safe to continue given complexity of the intervention and uncooperative patient without risking harm. Thus, I decided to abort the procedure.  Will continue medical management for now, including addition of long acting nitrate. I will discuss with the patient regarding repeat attempt.    Recent labs: 05/11/2020: Glucose 90, BUN/Cr 10/0.72. EGFR >60. Na/K 141/3.5.  H/H 12/38. MCV 91. Platelets 236 HbA1C  6.0% Chol 102, TG 92, HDL 34, LDL 68 TSH ?   Review of Systems  Cardiovascular: Positive for chest pain (Occasional) and dyspnea on exertion. Negative for leg swelling, palpitations and syncope.  Respiratory: Positive for shortness of breath.   Musculoskeletal: Positive for back pain.         Vitals:   06/11/20 1324  BP: 124/68  Pulse: 84  Resp: 16  SpO2: 95%     Body mass index is 40.42 kg/m. Filed Weights   06/11/20 1324  Weight: 221 lb (100.2 kg)     Objective:   Physical Exam Vitals and nursing note reviewed.  Constitutional:      General: She is not in acute distress. Neck:     Vascular: No JVD.  Cardiovascular:     Rate and Rhythm: Normal rate and regular rhythm.     Heart sounds: Normal heart sounds. No murmur heard.   Pulmonary:     Effort: Pulmonary effort is normal.     Breath sounds: Normal breath sounds. No wheezing or rales.           Assessment & Recommendations:   69 y.o. Caucasian female with hypertension, hyperlipidemia, former smoker CAD.  CAD: Severe prox LAD, OM1 and mod Prox Lcx disease. On current medical therapy, patient has no symptoms of angina.  She does have exertional dyspnea symptoms, which could be related to either COPD or coronary artery disease. Previous attempt at percutaneous coronary intervention (04/2020) was aborted due to patient noncooperation owing to her back pain and difficulty wearing mask.  On detailed discussion in the past, we have mutually decided to continue medical management for what seems to be stable coronary artery disease, without any unstable features. At this point, I recommend continued medical management.  I recommend evaluation for COPD with pulmonary function testing. I recommend Plavix over aspirin for treatment of her severe CAD.  Dual antiplatelet therapy not needed, unless coronary stenting is undertaken.  Continue high intensity statin, antianginal therapy-currently on metoprolol succinate  and isosorbide mononitrate. Should her symptoms of exertional dyspnea continue in spite of treatment of COPD, or absence of COPD on pulmonary function testing, I will reconsider coronary artery stenting.  Should we decide on proceeding with coronary intervention, I recommend the following I will plan first performing DFR on AV groove circumflex.  If this is negative, we will plan on stenting of OM1, with stent landing distal to the left circumflex.  If DFR of AV groove circumflex is positive, she may need dedicated to stenting approach. In addition, she will need stenting of proximal to mid LAD, and consideration for focal stenting in distal LAD.  Complex decision making  Follow-up in 4 weeks   Nigel Mormon, MD Pager: [phone] Office: [phone]

## 2020-07-09 ENCOUNTER — Ambulatory Visit: Payer: Medicare HMO | Admitting: Cardiology

## 2020-07-09 NOTE — Progress Notes (Signed)
Rescheduled

## 2020-07-12 ENCOUNTER — Ambulatory Visit: Payer: Medicare HMO | Admitting: Cardiology

## 2020-07-16 ENCOUNTER — Ambulatory Visit: Payer: Medicare HMO | Admitting: Cardiology

## 2020-08-30 ENCOUNTER — Other Ambulatory Visit: Payer: Self-pay | Admitting: Cardiology

## 2020-08-30 DIAGNOSIS — I25118 Atherosclerotic heart disease of native coronary artery with other forms of angina pectoris: Secondary | ICD-10-CM

## 2020-08-30 DIAGNOSIS — I10 Essential (primary) hypertension: Secondary | ICD-10-CM

## 2020-09-28 ENCOUNTER — Other Ambulatory Visit: Payer: Self-pay | Admitting: Cardiology

## 2020-09-28 DIAGNOSIS — I25118 Atherosclerotic heart disease of native coronary artery with other forms of angina pectoris: Secondary | ICD-10-CM

## 2020-09-28 DIAGNOSIS — I10 Essential (primary) hypertension: Secondary | ICD-10-CM

## 2020-10-11 ENCOUNTER — Ambulatory Visit: Payer: Medicare HMO | Admitting: Orthopaedic Surgery

## 2020-10-17 ENCOUNTER — Ambulatory Visit: Payer: Medicare HMO | Admitting: Orthopaedic Surgery

## 2020-10-30 ENCOUNTER — Ambulatory Visit: Payer: Medicare HMO | Admitting: Orthopaedic Surgery

## 2020-10-30 ENCOUNTER — Encounter: Payer: Self-pay | Admitting: Orthopaedic Surgery

## 2020-10-30 ENCOUNTER — Ambulatory Visit (INDEPENDENT_AMBULATORY_CARE_PROVIDER_SITE_OTHER): Payer: Medicare HMO

## 2020-10-30 ENCOUNTER — Ambulatory Visit: Payer: Self-pay

## 2020-10-30 DIAGNOSIS — M1711 Unilateral primary osteoarthritis, right knee: Secondary | ICD-10-CM

## 2020-10-30 DIAGNOSIS — M25561 Pain in right knee: Secondary | ICD-10-CM

## 2020-10-30 DIAGNOSIS — M25562 Pain in left knee: Secondary | ICD-10-CM | POA: Diagnosis not present

## 2020-10-30 DIAGNOSIS — G8929 Other chronic pain: Secondary | ICD-10-CM | POA: Diagnosis not present

## 2020-10-30 DIAGNOSIS — M1712 Unilateral primary osteoarthritis, left knee: Secondary | ICD-10-CM | POA: Diagnosis not present

## 2020-10-30 MED ORDER — METHYLPREDNISOLONE ACETATE 40 MG/ML IJ SUSP
40.0000 mg | INTRAMUSCULAR | Status: AC | PRN
  Administered 2020-10-30: 40 mg via INTRA_ARTICULAR

## 2020-10-30 MED ORDER — LIDOCAINE HCL 1 % IJ SOLN
3.0000 mL | INTRAMUSCULAR | Status: AC | PRN
  Administered 2020-10-30: 3 mL

## 2020-10-30 MED ORDER — METHOCARBAMOL 500 MG PO TABS: 500.0000 mg | ORAL_TABLET | Freq: Four times a day (QID) | ORAL | 1 refills | Status: AC | PRN

## 2020-10-30 NOTE — Progress Notes (Signed)
Office Visit Note   Patient: Krista Jimenez           Date of Birth: [birth year]           MRN: [id number] Visit Date: 10/30/2020              Requested by: Pieter Partridge, PA [address] [place],  [place] [address] PCP: Pieter Partridge, PA   Assessment & Plan: Visit Diagnoses:  1. Chronic pain of left knee   2. Chronic pain of right knee   3. Unilateral primary osteoarthritis, left knee   4. Unilateral primary osteoarthritis, right knee     Plan: I did recommend a steroid injection in both knees today to decrease her acute pain in the knees.  She cannot take anti-inflammatories.  She is a good camper hyaluronic acid and we will order this for both knees to see if this will help.  I gave her handout about this.  We will try some Robaxin for her back pain.  Hopefully we will see her in follow-up to place hyaluronic acid in both knees.  If she still having back issues then we can always get x-rays of her lumbar spine.  All questions and concerns were answered and addressed.  Follow-Up Instructions: No follow-ups on file.   Orders:  Orders Placed This Encounter  Procedures  . Large Joint Inj  . Large Joint Inj  . XR Knee 1-2 Views Left  . XR Knee 1-2 Views Right   Meds ordered this encounter  Medications  . methocarbamol (ROBAXIN) 500 MG tablet    Sig: Take 1 tablet (500 mg total) by mouth every 6 (six) hours as needed.    Dispense:  40 tablet    Refill:  1      Procedures: Large Joint Inj: L knee on 10/30/2020 9:51 AM Indications: diagnostic evaluation and pain Details: 22 G 1.5 in needle, superolateral approach  Arthrogram: No  Medications: 3 mL lidocaine 1 %; 40 mg methylPREDNISolone acetate 40 MG/ML Outcome: tolerated well, no immediate complications Procedure, treatment alternatives, risks and benefits explained, specific risks discussed. Consent was given by the patient. Immediately prior to procedure a time out was called to verify the correct patient,  procedure, equipment, support staff and site/side marked as required. Patient was prepped and draped in the usual sterile fashion.   Large Joint Inj: R knee on 10/30/2020 9:51 AM Indications: diagnostic evaluation and pain Details: 22 G 1.5 in needle, superolateral approach  Arthrogram: No  Medications: 3 mL lidocaine 1 %; 40 mg methylPREDNISolone acetate 40 MG/ML Outcome: tolerated well, no immediate complications Procedure, treatment alternatives, risks and benefits explained, specific risks discussed. Consent was given by the patient. Immediately prior to procedure a time out was called to verify the correct patient, procedure, equipment, support staff and site/side marked as required. Patient was prepped and draped in the usual sterile fashion.       Clinical Data: No additional findings.   Subjective: Chief Complaint  Patient presents with  . Right Knee - Pain  . Left Knee - Pain  The patient is someone I am seeing for the first time but I have seen her brother.  She is 70 years old and has chronic knee pain with the right worse than the left has been going on for years now.  She is never had surgery on the knees and never had injections.  She is not a diabetic.  She is on Plavix.  She does state that she has a CT scan of her lungs coming up soon to rule out any type of cancerous process.  She is really had no treatment for her knees.  She does walk with a cane.  Both her knees hurt daily and definitely affect her mobility, her quality of life, and her actives daily living.  She also reports right-sided low back pain.  HPI  Review of Systems She currently denies any fever, chills, nausea, vomiting  Objective: Vital Signs: There were no vitals taken for this visit.  Physical Exam She is alert and orient x3 and in no acute distress but very slow to mobilize Ortho Exam Examination of both knees shows no effusion but global tenderness with significant patellofemoral crepitation  throughout the arc of motion.  Both knees are ligamentously stable. Specialty Comments:  No specialty comments available.  Imaging: XR Knee 1-2 Views Left  Result Date: 10/30/2020 2 views of the left knee show no acute findings.  There is significant patellofemoral arthritic changes.  The medial lateral compartments are well maintained.  XR Knee 1-2 Views Right  Result Date: 10/30/2020 2 views of the right knee show no acute findings.  There is significant patellofemoral arthritic changes.  There is a small lateral osteophyte but the lateral medial joint spaces are well-maintained.    PMFS History: Patient Active Problem List   Diagnosis Date Noted  . Benign hypertension 05/12/2020  . Coronary artery disease of native artery of native heart with stable angina pectoris ([place]) 05/12/2020  . Angina pectoris ([place]) 05/08/2020  . Post PTCA 05/08/2020  . Cervical stenosis (uterine cervix) 12/17/2011  . Postmenopausal bleeding 11/26/2011   Past Medical History:  Diagnosis Date  . Hiatal hernia   . Hyperlipidemia   . Hypertension   . Ovarian cyst   . Spastic colon     Family History  Problem Relation Age of Onset  . Hypertension Mother   . Aneurysm Mother   . Stroke Mother   . Hypertension Father   . Cancer Father        prostate  . Stroke Father   . Heart disease Sister   . Hypertension Brother   . Cancer Brother   . Hypertension Brother   . Hypertension Brother   . Hypertension Brother   . Anesthesia problems Neg Hx     Past Surgical History:  Procedure Laterality Date  . [place]  . CORONARY BALLOON ANGIOPLASTY N/A 05/08/2020   Procedure: CORONARY BALLOON ANGIOPLASTY;  Surgeon: Nigel Mormon, MD;  Location: [facility];  Service: Cardiovascular;  Laterality: N/A;  . DILATION AND CURETTAGE OF UTERUS    . HYSTEROSCOPY WITH D & C  12/17/2011   Procedure: DILATATION AND CURETTAGE /HYSTEROSCOPY;  Surgeon: Osborne Oman, MD;  Location: [facility];  Service: Gynecology;  Laterality: N/A;  . INTRAVASCULAR ULTRASOUND/IVUS N/A 05/08/2020   Procedure: Intravascular Ultrasound/IVUS;  Surgeon: Nigel Mormon, MD;  Location: [facility];  Service: Cardiovascular;  Laterality: N/A;  . LAPAROSCOPY     removal of ectopic preg  . LEFT HEART CATH AND CORONARY ANGIOGRAPHY N/A 05/08/2020   Procedure: LEFT HEART CATH AND CORONARY ANGIOGRAPHY;  Surgeon: Nigel Mormon, MD;  Location: [facility];  Service: Cardiovascular;  Laterality: N/A;  . TUBAL LIGATION  1985   Social History   Occupational History  . Not on file  Tobacco Use  . Smoking status: Former Smoker  Packs/day: 1.00    Years: 39.00    Pack years: 39.00    Types: Cigarettes    Quit date: 04/11/2020    Years since quitting: 0.5  . Smokeless tobacco: Never Used  Vaping Use  . Vaping Use: Never used  Substance and Sexual Activity  . Alcohol use: Not Currently    Comment: occ beer  . Drug use: No  . Sexual activity: Never    Birth control/protection: Surgical    Comment: np interest

## 2020-10-31 ENCOUNTER — Telehealth: Payer: Self-pay

## 2020-10-31 NOTE — Telephone Encounter (Signed)
Bilateral knee injections

## 2020-11-01 NOTE — Telephone Encounter (Signed)
Noted  

## 2020-11-08 ENCOUNTER — Telehealth: Payer: Self-pay

## 2020-11-08 NOTE — Telephone Encounter (Signed)
Submitted VOB for Monovisc, bilateral knee.  Pending BV. 

## 2020-12-03 ENCOUNTER — Telehealth: Payer: Self-pay

## 2020-12-03 NOTE — Telephone Encounter (Signed)
PA has been submitted for Monovisc, bilateral knee online through [place]. PA Pending# [id number]

## 2020-12-06 ENCOUNTER — Telehealth: Payer: Self-pay

## 2020-12-06 ENCOUNTER — Telehealth: Payer: Self-pay | Admitting: Orthopaedic Surgery

## 2020-12-06 DIAGNOSIS — M1712 Unilateral primary osteoarthritis, left knee: Secondary | ICD-10-CM

## 2020-12-06 DIAGNOSIS — M1711 Unilateral primary osteoarthritis, right knee: Secondary | ICD-10-CM

## 2020-12-06 NOTE — Telephone Encounter (Signed)
The only thing we can do then is send her to outpatient physical therapy to work on strengthening her knees so we can document this and then hopefully get approval for hyaluronic acid injections.  That is all we can do for now

## 2020-12-06 NOTE — Telephone Encounter (Signed)
Received call from Albers from [facility] [employer]. Miranda said they need more information before they can auth the injection.  She advised they need this information as soon as possible. The number to contact Jeannetta Nap is [phone]

## 2020-12-06 NOTE — Telephone Encounter (Signed)
PA is pending for Monovisc, bilateral knee due to needing updated documentation of conservative treatment such as education, strengthening, ROM exercises, assisted devices, and weight loss.  Please advise.  Thank you.

## 2020-12-07 NOTE — Telephone Encounter (Signed)
PT order placed in chart 

## 2020-12-07 NOTE — Addendum Note (Signed)
Addended by: Elvin So L on: 12/07/2020 10:30 AM   Modules accepted: Orders

## 2020-12-07 NOTE — Telephone Encounter (Signed)
Talked with patient and advised her that PA is being denied for Monovisc due to not trying PT.  Advised patient of Dr. Trevor Mace message below.  Patient voiced that she understands.

## 2020-12-07 NOTE — Telephone Encounter (Signed)
PA has been withdrawn.

## 2020-12-25 ENCOUNTER — Ambulatory Visit: Payer: Medicare HMO | Admitting: Physical Therapy

## 2021-01-08 ENCOUNTER — Encounter: Payer: Self-pay | Admitting: Physical Therapy

## 2021-01-08 ENCOUNTER — Other Ambulatory Visit: Payer: Self-pay

## 2021-01-08 ENCOUNTER — Ambulatory Visit (INDEPENDENT_AMBULATORY_CARE_PROVIDER_SITE_OTHER): Payer: Medicare HMO | Admitting: Physical Therapy

## 2021-01-08 DIAGNOSIS — M25561 Pain in right knee: Secondary | ICD-10-CM | POA: Diagnosis not present

## 2021-01-08 DIAGNOSIS — M25562 Pain in left knee: Secondary | ICD-10-CM | POA: Diagnosis not present

## 2021-01-08 DIAGNOSIS — R2689 Other abnormalities of gait and mobility: Secondary | ICD-10-CM

## 2021-01-08 DIAGNOSIS — G8929 Other chronic pain: Secondary | ICD-10-CM

## 2021-01-08 DIAGNOSIS — M6281 Muscle weakness (generalized): Secondary | ICD-10-CM | POA: Diagnosis not present

## 2021-01-08 NOTE — Therapy (Signed)
[facility] Physical Therapy [address] [place], [place], [address] Phone: [phone]   Fax:  [phone]  Physical Therapy Evaluation  Patient Details  Name: Krista Jimenez MRN: [id number] Date of Birth: 11-06-1950 Referring Provider (PT): Dr Jean Rosenthal   Encounter Date: 01/08/2021   PT End of Session - 01/08/21 1142    Visit Number 1    Number of Visits 8    Date for PT Re-Evaluation 02/19/21    Authorization Type human MCR    Authorization Time Period requested visits    Progress Note Due on Visit 10    PT Start Time 6063    PT Stop Time 1229    PT Time Calculation (min) 47 min    Activity Tolerance Patient limited by pain;Patient tolerated treatment well    Behavior During Therapy [facility] for tasks assessed/performed           Past Medical History:  Diagnosis Date  . Hiatal hernia   . Hyperlipidemia   . Hypertension   . Ovarian cyst   . Spastic colon     Past Surgical History:  Procedure Laterality Date  . [employer]  . CORONARY BALLOON ANGIOPLASTY N/A 05/08/2020   Procedure: CORONARY BALLOON ANGIOPLASTY;  Surgeon: Nigel Mormon, MD;  Location: [place];  Service: Cardiovascular;  Laterality: N/A;  . DILATION AND CURETTAGE OF UTERUS    . HYSTEROSCOPY WITH D & C  12/17/2011   Procedure: DILATATION AND CURETTAGE /HYSTEROSCOPY;  Surgeon: Osborne Oman, MD;  Location: [facility];  Service: Gynecology;  Laterality: N/A;  . INTRAVASCULAR ULTRASOUND/IVUS N/A 05/08/2020   Procedure: Intravascular Ultrasound/IVUS;  Surgeon: Nigel Mormon, MD;  Location: [facility];  Service: Cardiovascular;  Laterality: N/A;  . LAPAROSCOPY     removal of ectopic preg  . LEFT HEART CATH AND CORONARY ANGIOGRAPHY N/A 05/08/2020   Procedure: LEFT HEART CATH AND CORONARY ANGIOGRAPHY;  Surgeon: Nigel Mormon, MD;  Location: [facility];  Service: Cardiovascular;  Laterality: N/A;  . TUBAL LIGATION  1985     There were no vitals filed for this visit.    Subjective Assessment - 01/08/21 1142    Subjective Pt reports she has arthritis all over, she is referred to PT for her knees after having a cortisone injection.  Her insurance wants her to try PT before approving other injections/treatment    Pertinent History COPD, emphy    How long can you walk comfortably? household, can go further with assistive device    Diagnostic tests xrays    Patient Stated Goals walk and feel better, reduce some aches and pains.    Currently in Pain? Yes    Pain Score 5     Pain Location Knee    Pain Orientation Left;Right    Pain Descriptors / Indicators Dull;Sharp    Pain Type Chronic pain    Pain Onset More than a month ago    Pain Frequency Intermittent    Aggravating Factors  walking and after prolonged sitting    Pain Relieving Factors cream from MD              [facility] PT Assessment - 01/08/21 0001      Assessment   Medical Diagnosis OA bilat knees    Referring Provider (PT) Dr Jean Rosenthal    Onset Date/Surgical Date --   years ago   Hand Dominance Right    Next MD Visit after PT  Prior Therapy none      Precautions   Precautions None      Balance Screen   Has the patient fallen in the past 6 months No    Has the patient had a decrease in activity level because of a fear of falling?  No    Is the patient reluctant to leave their home because of a fear of falling?  No      Home [employer] managernvironment   Living Environment Private residence    Living Arrangements Spouse/significant other    Home Access Stairs to enter   4 uses railings to assist   Home Layout One level      Prior Function   Level of Independence Independent    Vocation Retired    Leisure play bingo with granddaughter      Observation/Other Assessments   Focus on Therapeutic Outcomes (FOTO)  47 ( goal 55)      Functional Tests   Functional tests Squat;Single leg stance      Squat   Comments knee abduction       Single Leg Stance   Comments Lt > 15 sec, unable to perform on Rt      ROM / Strength   AROM / PROM / Strength AROM;Strength      AROM   AROM Assessment Site Hip;Knee    Right/Left Knee Left;Right    Right Knee Extension -2    Right Knee Flexion 128    Left Knee Extension -5    Left Knee Flexion 128      Strength   Strength Assessment Site Hip;Knee;Ankle    Right/Left Hip --   flex 5/5, abd Lt 4++/5, Rt 4-/5, ext 4+/5   Right/Left Knee --   Lt 5/5, Rt 4+/5   Right/Left Ankle --   Lt 5/5, Rt 4-/5     Flexibility   Soft Tissue Assessment /Muscle Length yes    Hamstrings SLR Rt 65, Lt 52    Quadriceps tightness bilat      Palpation   Patella mobility hypomobile      Transfers   Transfers Sit to Stand    Sit to Stand 6: Modified independent (Device/Increase time);With upper extremity assist    Five time sit to stand comments  13 sec      Ambulation/Gait   Ambulation/Gait Yes    Assistive device None    Gait Pattern Antalgic;Decreased hip/knee flexion - right                      Objective measurements completed on examination: See above findings.      Performed HEP per handout in education section, VC for form         PT Education - 01/08/21 1231    Education Details HEP, POC    Person(s) Educated Patient    Methods Explanation;Demonstration;Handout    Comprehension Returned demonstration;Verbalized understanding               PT Long Term Goals - 01/08/21 1238      PT LONG TERM GOAL #1   Title I with advanced HEP    Time 6    Period Weeks    Status New    Target Date 02/19/21      PT LONG TERM GOAL #2   Title improve FOTO =/> 55    Time 6    Period Weeks    Status New    Target Date 02/19/21  PT LONG TERM GOAL #3   Title improve Rt hip strength =/> 5-/5 to support single leg stance    Time 6    Period Weeks    Status New    Target Date 02/19/21      PT LONG TERM GOAL #4   Title report =/> 50% reduction in pain  in bilat knees with mobility    Time 6    Period Weeks    Status New    Target Date 02/19/21      PT LONG TERM GOAL #5   Title perform Rt SLS =/> 10 sec to decrease risk of falls    Time 6    Period Weeks    Status New    Target Date 02/19/21                  Plan - 01/08/21 1233    Clinical Impression Statement 70 yo female with long h/o bilat knee arthritis.  She is referred to PT as her insurance wants her to try this before qualifying for injections.  She does not drive and is dependent on family for transportation making it difficult for her.  She has decreased strength in bilat hips and  Rt knee.   she is hypomobile in her patellas, has antalgic gait and pain with all activities.  She also has multiple other medical diagnosis that could slow down her progress.    Personal Factors and Comorbidities Comorbidity 3+    Comorbidities COPD, HTN, hiatal hernia, " arthritis everywhere"    Examination-Activity Limitations Bed Mobility;Transfers;Carry;Squat;Stairs;Stand    Examination-Participation Restrictions Shop;Community Activity;Other    Stability/Clinical Decision Making Stable/Uncomplicated    Clinical Decision Making Low    Rehab Potential Good    PT Frequency 2x / week    PT Duration 6 weeks    PT Treatment/Interventions Iontophoresis 4mg /ml Dexamethasone;Gait training;Taping;Vasopneumatic Device;Patient/family education;Functional mobility training;Moist Heat;Passive range of motion;Therapeutic exercise;Dry needling;Manual techniques;Electrical Stimulation    PT Next Visit Plan progress lower body strengthening, hip flexibility    PT Home Exercise Plan [email or web address]    Consulted and Agree with Plan of Care Patient           Patient will benefit from skilled therapeutic intervention in order to improve the following deficits and impairments:  Difficulty walking,Obesity,Pain,Decreased endurance,Decreased activity tolerance,Decreased strength,Postural dysfunction  Visit  Diagnosis: Chronic pain of right knee - Plan: PT plan of care cert/re-cert  Chronic pain of left knee - Plan: PT plan of care cert/re-cert  Muscle weakness (generalized) - Plan: PT plan of care cert/re-cert  Other abnormalities of gait and mobility - Plan: PT plan of care cert/re-cert     Problem List Patient Active Problem List   Diagnosis Date Noted  . Benign hypertension 05/12/2020  . Coronary artery disease of native artery of native heart with stable angina pectoris ([place]) 05/12/2020  . Angina pectoris ([place]) 05/08/2020  . Post PTCA 05/08/2020  . Cervical stenosis (uterine cervix) 12/17/2011  . Postmenopausal bleeding 11/26/2011    Jeral Pinch PT  01/08/2021, 12:42 PM  [facility] Physical Therapy [address] [place], [place], [address] Phone: [phone]   Fax:  [phone]  Name: Krista Jimenez MRN: [id number] Date of Birth: February 15, 1951   Referring diagnosis? [id number], [id number] Treatment diagnosis? (if different than referring diagnosis) [id number], [id number], [id number], [phone] What was this (referring dx) caused by? []  Surgery []  Fall []  Ongoing issue [x]  Arthritis []  Other: ____________  Laterality: []  Rt []  Lt [x]  Both  Check  all possible CPT codes:      [x]  97110 (Therapeutic Exercise)  []  92507 (SLP Treatment)  [x]  97112 (Neuro Re-ed)   []  92526 (Swallowing Treatment)   []  97116 (Gait Training)   []  [id number] (Cognitive Training, 1st 15 minutes) [x]  97140 (Manual Therapy)   []  97130 (Cognitive Training, each add'l 15 minutes)  []  97530 (Therapeutic Activities)  []  Other, List CPT Code ____________    [x]  97535 (Self Care)       []  All codes above (97110 - 97535)  []  97012 (Mechanical Traction)  [x]  97014 (E-stim Unattended)  []  97032 (E-stim manual)  [x]  97033 (Ionto)  []  97035 (Ultrasound)  []  97760 (Orthotic Fit) []  [id number] (Physical Performance Training) []  [id number] (Aquatic Therapy) []  97034 (Contrast Bath) []  [id number] (Paraffin) []  97597 (Wound Care  1st 20 sq cm) []  97598 (Wound Care each add'l 20 sq cm) [x]  97016 (Vasopneumatic Device) []  [id number] Comptroller) []  [id number] (Prosthetic Training)

## 2021-01-08 NOTE — Patient Instructions (Signed)
Access Code: [email or web address] URL: [email or web address] Date: 01/08/2021 Prepared by: Jeral Pinch  Exercises Sit to Stand - 1 x daily - 2 sets - 10 reps Sitting Knee Extension with Resistance - 1 x daily - 2 sets - 10 reps Standing Hip Abduction with Counter Support - 1 x daily - 2 sets - 10 reps Standing Hip Extension with Counter Support - 1 x daily - 2 sets - 10 reps

## 2021-01-16 ENCOUNTER — Encounter: Payer: Medicare HMO | Admitting: Physical Therapy

## 2021-01-23 ENCOUNTER — Other Ambulatory Visit: Payer: Self-pay

## 2021-01-23 ENCOUNTER — Encounter: Payer: Self-pay | Admitting: Physical Therapy

## 2021-01-23 ENCOUNTER — Ambulatory Visit (INDEPENDENT_AMBULATORY_CARE_PROVIDER_SITE_OTHER): Payer: Medicare HMO | Admitting: Physical Therapy

## 2021-01-23 DIAGNOSIS — M6281 Muscle weakness (generalized): Secondary | ICD-10-CM

## 2021-01-23 DIAGNOSIS — G8929 Other chronic pain: Secondary | ICD-10-CM

## 2021-01-23 DIAGNOSIS — M25562 Pain in left knee: Secondary | ICD-10-CM

## 2021-01-23 DIAGNOSIS — M25561 Pain in right knee: Secondary | ICD-10-CM

## 2021-01-23 DIAGNOSIS — R2689 Other abnormalities of gait and mobility: Secondary | ICD-10-CM

## 2021-01-23 NOTE — Therapy (Signed)
[facility] [address] [place], [place], [address] Phone: [phone]   Fax:  [phone]  Physical Therapy Treatment  Patient Details  Name: Krista Jimenez MRN: [id number] Date of Birth: [birth year] Referring Provider (PT): Dr Jean Rosenthal   Encounter Date: 01/23/2021   PT End of Session - 01/23/21 1038    Visit Number 2    Number of Visits 8    Date for PT Re-Evaluation 02/19/21    Authorization Type human MCR    Authorization Time Period requested visits    Progress Note Due on Visit 10    PT Start Time 1010    PT Stop Time 1048    PT Time Calculation (min) 38 min    Activity Tolerance Patient limited by pain;Patient tolerated treatment well    Behavior During Therapy [facility] for tasks assessed/performed           Past Medical History:  Diagnosis Date  . Hiatal hernia   . Hyperlipidemia   . Hypertension   . Ovarian cyst   . Spastic colon     Past Surgical History:  Procedure Laterality Date  . [place]  . CORONARY BALLOON ANGIOPLASTY N/A 05/08/2020   Procedure: CORONARY BALLOON ANGIOPLASTY;  Surgeon: Nigel Mormon, MD;  Location: [place] [facility];  Service: Cardiovascular;  Laterality: N/A;  . DILATION AND CURETTAGE OF UTERUS    . HYSTEROSCOPY WITH D & C  12/17/2011   Procedure: DILATATION AND CURETTAGE /HYSTEROSCOPY;  Surgeon: Osborne Oman, MD;  Location: [facility];  Service: Gynecology;  Laterality: N/A;  . INTRAVASCULAR ULTRASOUND/IVUS N/A 05/08/2020   Procedure: Intravascular Ultrasound/IVUS;  Surgeon: Nigel Mormon, MD;  Location: [place] [facility];  Service: Cardiovascular;  Laterality: N/A;  . LAPAROSCOPY     removal of ectopic preg  . LEFT HEART CATH AND CORONARY ANGIOGRAPHY N/A 05/08/2020   Procedure: LEFT HEART CATH AND CORONARY ANGIOGRAPHY;  Surgeon: Nigel Mormon, MD;  Location: [place] [facility];  Service: Cardiovascular;  Laterality: N/A;  . TUBAL LIGATION  1985     There were no vitals filed for this visit.   Subjective Assessment - 01/23/21 1035    Subjective relays 5/10 overall bilateral knee pain today    Pertinent History COPD, emphy    How long can you walk comfortably? household, can go further with assistive device    Diagnostic tests xrays    Patient Stated Goals walk and feel better, reduce some aches and pains.    Pain Onset More than a month ago            [facility] Adult PT Treatment/Exercise - 01/23/21 0001      Exercises   Exercises Knee/Hip      Knee/Hip Exercises: Stretches   Active Hamstring Stretch Both;2 reps;20 seconds    Knee: Self-Stretch Limitations seated tailgate heelsides with self O.P 10 sec X10 bilat      Knee/Hip Exercises: Aerobic   Recumbent Bike 5 min needed multiple rest breaks and had some knee pain with this, may be more more suited for nu step next visit      Knee/Hip Exercises: Machines for Strengthening   Cybex Leg Press 50# bilat 2X15      Knee/Hip Exercises: Standing   Heel Raises Both;15 reps    Heel Raises Limitations with UE support    Knee Flexion Both;15 reps    Knee Flexion Limitations 2# with UE support    Hip Abduction Both;15  reps    Abduction Limitations 2# with UE support    Hip Extension Both;15 reps    Extension Limitations 2# with UE support      Knee/Hip Exercises: Seated   Long Arc Quad Both;20 reps    Long Arc Quad Weight 2 lbs.    Marching Both;20 reps    Marching Limitations 2#             PT Long Term Goals - 01/08/21 1238      PT LONG TERM GOAL #1   Title I with advanced HEP    Time 6    Period Weeks    Status New    Target Date 02/19/21      PT LONG TERM GOAL #2   Title improve FOTO =/> 55    Time 6    Period Weeks    Status New    Target Date 02/19/21      PT LONG TERM GOAL #3   Title improve Rt hip strength =/> 5-/5 to support single leg stance    Time 6    Period Weeks    Status New    Target Date 02/19/21      PT LONG TERM GOAL #4   Title  report =/> 50% reduction in pain in bilat knees with mobility    Time 6    Period Weeks    Status New    Target Date 02/19/21      PT LONG TERM GOAL #5   Title perform Rt SLS =/> 10 sec to decrease risk of falls    Time 6    Period Weeks    Status New    Target Date 02/19/21                 Plan - 01/23/21 1107    Clinical Impression Statement Session focused on bilat hip/knee stength and bilat knee ROM to her tolerance. She does get [facility] and fatigued easily and needs rest breaks. We will try to gradually progress her as tolerated.    Personal Factors and Comorbidities Comorbidity 3+    Comorbidities COPD, HTN, hiatal hernia, " arthritis everywhere"    Examination-Activity Limitations Bed Mobility;Transfers;Carry;Squat;Stairs;Stand    Examination-Participation Restrictions Shop;Community Activity;Other    Stability/Clinical Decision Making Stable/Uncomplicated    Rehab Potential Good    PT Frequency 2x / week    PT Duration 6 weeks    PT Treatment/Interventions Iontophoresis 4mg /ml Dexamethasone;Gait training;Taping;Vasopneumatic Device;Patient/family education;Functional mobility training;Moist Heat;Passive range of motion;Therapeutic exercise;Dry needling;Manual techniques;Electrical Stimulation    PT Next Visit Plan progress lower body strengthening, hip flexibility    PT Home Exercise Plan [id number]    Consulted and Agree with Plan of Care Patient           Patient will benefit from skilled therapeutic intervention in order to improve the following deficits and impairments:  Difficulty walking,Obesity,Pain,Decreased endurance,Decreased activity tolerance,Decreased strength,Postural dysfunction  Visit Diagnosis: Chronic pain of right knee  Chronic pain of left knee  Muscle weakness (generalized)  Other abnormalities of gait and mobility     Problem List Patient Active Problem List   Diagnosis Date Noted  . Benign hypertension 05/12/2020  . Coronary artery  disease of native artery of native heart with stable angina pectoris ([place]) 05/12/2020  . Angina pectoris ([place]) 05/08/2020  . Post PTCA 05/08/2020  . Cervical stenosis (uterine cervix) 12/17/2011  . Postmenopausal bleeding 11/26/2011    Silvestre Mesi 01/23/2021, 11:11 AM  [facility] [address] [place], [place], [address] Phone: [phone]   Fax:  [phone]  Name: NARMEEN KERPER MRN: [id number] Date of Birth: 08/01/1951

## 2021-01-29 ENCOUNTER — Other Ambulatory Visit: Payer: Self-pay

## 2021-01-29 ENCOUNTER — Ambulatory Visit (INDEPENDENT_AMBULATORY_CARE_PROVIDER_SITE_OTHER): Payer: Medicare HMO | Admitting: Rehabilitative and Restorative Service Providers"

## 2021-01-29 ENCOUNTER — Encounter: Payer: Self-pay | Admitting: Rehabilitative and Restorative Service Providers"

## 2021-01-29 DIAGNOSIS — M6281 Muscle weakness (generalized): Secondary | ICD-10-CM

## 2021-01-29 DIAGNOSIS — M25561 Pain in right knee: Secondary | ICD-10-CM | POA: Diagnosis not present

## 2021-01-29 DIAGNOSIS — R2689 Other abnormalities of gait and mobility: Secondary | ICD-10-CM

## 2021-01-29 DIAGNOSIS — M25562 Pain in left knee: Secondary | ICD-10-CM

## 2021-01-29 DIAGNOSIS — G8929 Other chronic pain: Secondary | ICD-10-CM

## 2021-01-29 NOTE — Therapy (Addendum)
[facility] [address] [place], [place], [address] Phone: [phone]   Fax:  [phone]  Physical Therapy Treatment/Discharge   Patient Details  Name: Krista Jimenez MRN: [id number] Date of Birth: 06-Aug-1951 Referring Provider (PT): Dr Jean Rosenthal   Encounter Date: 01/29/2021   PT End of Session - 01/29/21 0931     Visit Number 3    Number of Visits 8    Date for PT Re-Evaluation 02/19/21    Authorization Type humana, 12 visits approved    Authorization Time Period until 03/08/2021    Authorization - Visit Number 3    Authorization - Number of Visits 12    Progress Note Due on Visit 10    PT Start Time 0929    PT Stop Time 1007    PT Time Calculation (min) 38 min    Activity Tolerance Patient tolerated treatment well    Behavior During Therapy [facility] for tasks assessed/performed             Past Medical History:  Diagnosis Date   Hiatal hernia    Hyperlipidemia    Hypertension    Ovarian cyst    Spastic colon     Past Surgical History:  Procedure Laterality Date   [place] N/A 05/08/2020   Procedure: CORONARY BALLOON ANGIOPLASTY;  Surgeon: Nigel Mormon, MD;  Location: [place] [facility];  Service: Cardiovascular;  Laterality: N/A;   DILATION AND CURETTAGE OF UTERUS     HYSTEROSCOPY WITH D & C  12/17/2011   Procedure: DILATATION AND CURETTAGE /HYSTEROSCOPY;  Surgeon: Osborne Oman, MD;  Location: [facility];  Service: Gynecology;  Laterality: N/A;   INTRAVASCULAR ULTRASOUND/IVUS N/A 05/08/2020   Procedure: Intravascular Ultrasound/IVUS;  Surgeon: Nigel Mormon, MD;  Location: [place] [facility];  Service: Cardiovascular;  Laterality: N/A;   LAPAROSCOPY     removal of ectopic preg   LEFT HEART CATH AND CORONARY ANGIOGRAPHY N/A 05/08/2020   Procedure: LEFT HEART CATH AND CORONARY ANGIOGRAPHY;  Surgeon: Nigel Mormon, MD;  Location: [place] [facility];   Service: Cardiovascular;  Laterality: N/A;   TUBAL LIGATION  1985    There were no vitals filed for this visit.   Subjective Assessment - 01/29/21 0933     Subjective Pt. stated 5/10 pain, better at times.  Pt. stated feeling like getting up and doing house work is getting some easier.    Pertinent History COPD, emphy    How long can you walk comfortably? household, can go further with assistive device    Diagnostic tests xrays    Patient Stated Goals walk and feel better, reduce some aches and pains.    Currently in Pain? Yes    Pain Score 5     Pain Location Knee    Pain Orientation Left;Right    Pain Descriptors / Indicators Dull    Pain Type Chronic pain    Pain Onset More than a month ago    Pain Frequency Intermittent    Aggravating Factors  walking/standing prolonged    Pain Relieving Factors resting                [facility] PT Assessment - 01/29/21 0001       Assessment   Medical Diagnosis OA bilat knees    Referring Provider (PT) Dr Jean Rosenthal      Transfers   Comments sit to stand from 20 inch table c  no UE assist, back pain limiting                           [facility] Adult PT Treatment/Exercise - 01/29/21 0001       Knee/Hip Exercises: Aerobic   Recumbent Bike 5 mins for ROM primarily c Pt. selected pace with rest break at 2.5 mins      Knee/Hip Exercises: Standing   Hip Abduction 15 reps;Both   held weight due to standing back symptoms     Knee/Hip Exercises: Seated   Long Arc Quad Both   2 x 10 bilateral 2. 5 lbs   Long Arc Quad Weight --   2.5   Marching Both;2 sets;10 reps   2 x 10 bilateral alternating 2.5 lbs   Marching Weights --   2.5   Sit to [employer] without UE support;5 reps   limited due to back symptoms     Knee/Hip Exercises: Supine   Other Supine Knee/Hip Exercises supine heel slide to SLR combo x 10 bilateral                    PT Education - 01/29/21 0959     Education Details Education given around  progres aerobic exercise benefits    Person(s) Educated Patient    Methods Explanation    Comprehension Verbalized understanding                 PT Long Term Goals - 01/08/21 1238       PT LONG TERM GOAL #1   Title I with advanced HEP    Time 6    Period Weeks    Status New    Target Date 02/19/21      PT LONG TERM GOAL #2   Title improve FOTO =/> 55    Time 6    Period Weeks    Status New    Target Date 02/19/21      PT LONG TERM GOAL #3   Title improve Rt hip strength =/> 5-/5 to support single leg stance    Time 6    Period Weeks    Status New    Target Date 02/19/21      PT LONG TERM GOAL #4   Title report =/> 50% reduction in pain in bilat knees with mobility    Time 6    Period Weeks    Status New    Target Date 02/19/21      PT LONG TERM GOAL #5   Title perform Rt SLS =/> 10 sec to decrease risk of falls    Time 6    Period Weeks    Status New    Target Date 02/19/21                   Plan - 01/29/21 3810     Clinical Impression Statement Presentation of back symptoms and fatigue limited standing intervention and futher progression in progressive mobility training (transfers).  Pt. did report some overall improvements in functional home activity as documented in subjective data.    Personal Factors and Comorbidities Comorbidity 3+    Comorbidities COPD, HTN, hiatal hernia, " arthritis everywhere"    Examination-Activity Limitations Bed Mobility;Transfers;Carry;Squat;Stairs;Stand    Examination-Participation Restrictions Shop;Community Activity;Other    Stability/Clinical Decision Making Stable/Uncomplicated    Rehab Potential Good    PT Frequency 2x / week    PT Duration 6 weeks  PT Treatment/Interventions Iontophoresis 87m/ml Dexamethasone;Gait training;Taping;Vasopneumatic Device;Patient/family education;Functional mobility training;Moist Heat;Passive range of motion;Therapeutic exercise;Dry needling;Manual techniques;Electrical  Stimulation    PT Next Visit Plan Continue to build leg and hp strength to progress improvements c functional mobility c adjustments based off back symptoms and shortness of breath    PT Home Exercise Plan [id number]    Consulted and Agree with Plan of Care Patient             Patient will benefit from skilled therapeutic intervention in order to improve the following deficits and impairments:  Difficulty walking,Obesity,Pain,Decreased endurance,Decreased activity tolerance,Decreased strength,Postural dysfunction  Visit Diagnosis: Chronic pain of right knee  Chronic pain of left knee  Muscle weakness (generalized)  Other abnormalities of gait and mobility     Problem List Patient Active Problem List   Diagnosis Date Noted   Benign hypertension 05/12/2020   Coronary artery disease of native artery of native heart with stable angina pectoris ([place] 05/12/2020   Angina pectoris ([place] 05/08/2020   Post PTCA 05/08/2020   Cervical stenosis (uterine cervix) 12/17/2011   Postmenopausal bleeding 11/26/2011    MScot Jun PT, DPT, OCS, ATC 01/29/21  10:10 AM  PHYSICAL THERAPY DISCHARGE SUMMARY  Visits from Start of Care: 3  Current functional level related to goals / functional outcomes: See note   Remaining deficits: See note   Education / Equipment: HEP   Patient agrees to discharge. Patient goals were partially met. Patient is being discharged due to not returning since the last visit.  MScot Jun PT, DPT, OCS, ATC 04/17/21  2:04 PM     [facility] [address] [address]Phone: [phone]  Fax:  [phone] Name: Krista BACHMANNMRN: [id number] of Birth: 109/17/1952

## 2021-02-06 ENCOUNTER — Encounter: Payer: Medicare HMO | Admitting: Physical Therapy

## 2021-04-09 ENCOUNTER — Other Ambulatory Visit: Payer: Self-pay | Admitting: Physician Assistant

## 2021-04-09 DIAGNOSIS — D1723 Benign lipomatous neoplasm of skin and subcutaneous tissue of right leg: Secondary | ICD-10-CM

## 2021-04-16 ENCOUNTER — Ambulatory Visit
Admission: RE | Admit: 2021-04-16 | Discharge: 2021-04-16 | Disposition: A | Discharge status: Home / Self Care | Payer: Medicare HMO | Source: Ambulatory Visit | Attending: Physician Assistant | Admitting: Physician Assistant

## 2021-04-16 DIAGNOSIS — D1723 Benign lipomatous neoplasm of skin and subcutaneous tissue of right leg: Secondary | ICD-10-CM

## 2021-07-14 ENCOUNTER — Other Ambulatory Visit: Payer: Self-pay | Admitting: Cardiology

## 2021-07-14 DIAGNOSIS — I10 Essential (primary) hypertension: Secondary | ICD-10-CM

## 2021-07-14 DIAGNOSIS — I25118 Atherosclerotic heart disease of native coronary artery with other forms of angina pectoris: Secondary | ICD-10-CM

## 2021-07-31 ENCOUNTER — Other Ambulatory Visit: Payer: Self-pay | Admitting: Family Medicine

## 2021-07-31 DIAGNOSIS — Z13820 Encounter for screening for osteoporosis: Secondary | ICD-10-CM

## 2021-12-26 IMAGING — US US EXTREM LOW*R* LIMITED
1 series · 8 of 8 positions shown · non-contrast
Comparison: None.

CLINICAL DATA: Right lower mid shin swelling for 2 months

EXAM:
ULTRASOUND RIGHT LOWER EXTREMITY LIMITED
TECHNIQUE: Ultrasound examination of the lower extremity soft tissues was
performed in the area of clinical concern.

[Series 1: us extrem low*right* limited · 0.07mm/px · 8 of 8 slices shown]
[im 1/8]
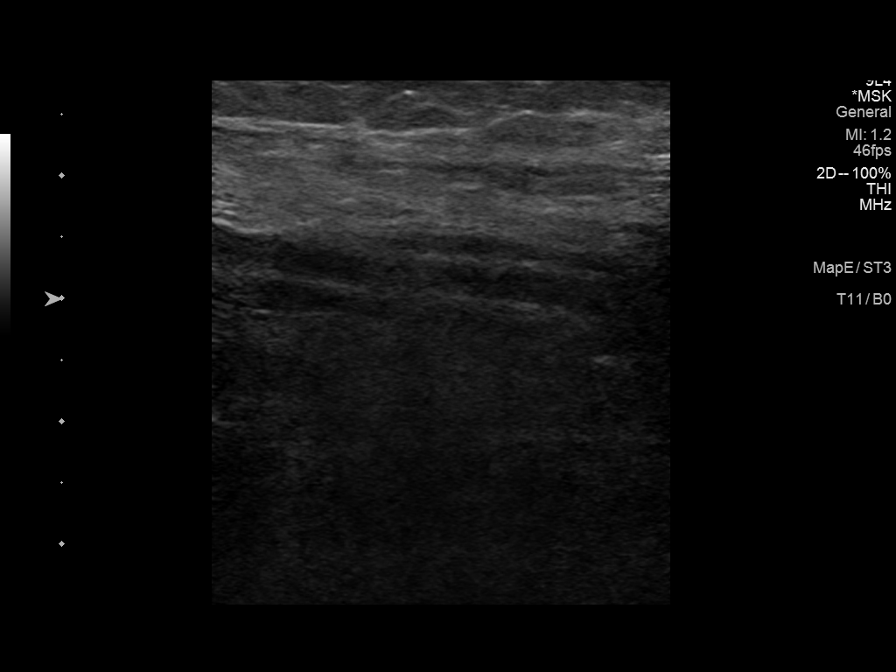
[im 2/8]
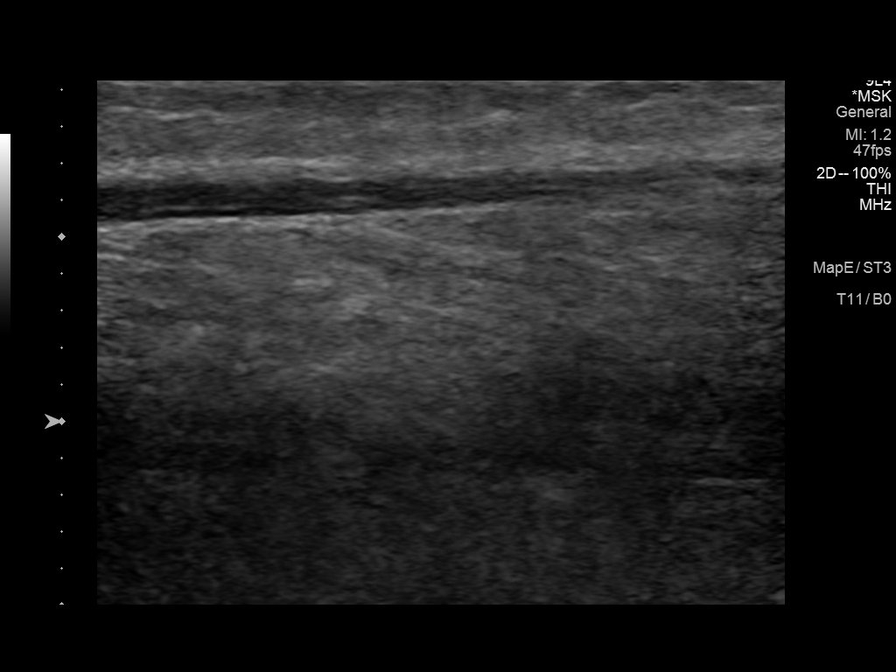
[im 3/8]
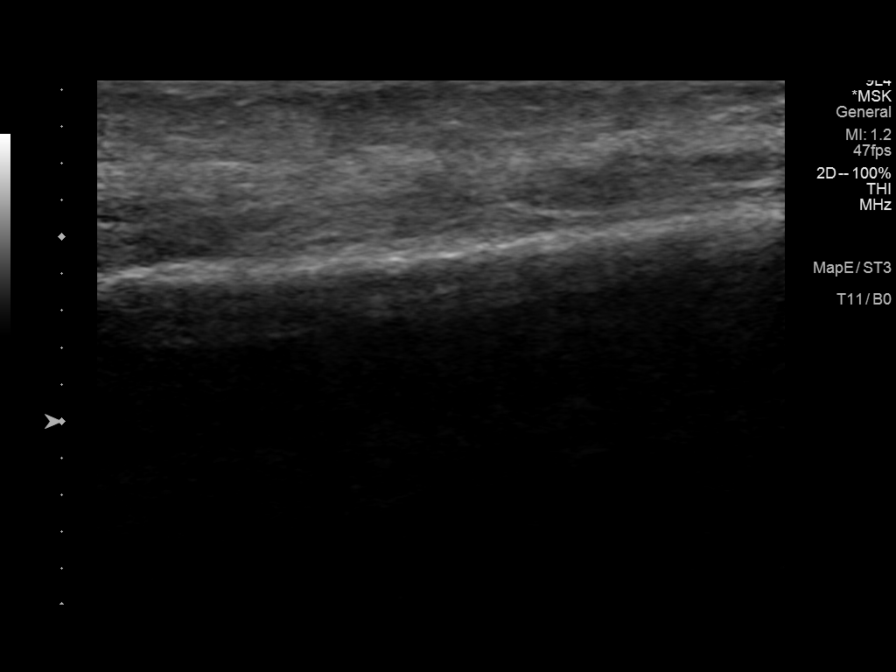
[im 4/8]
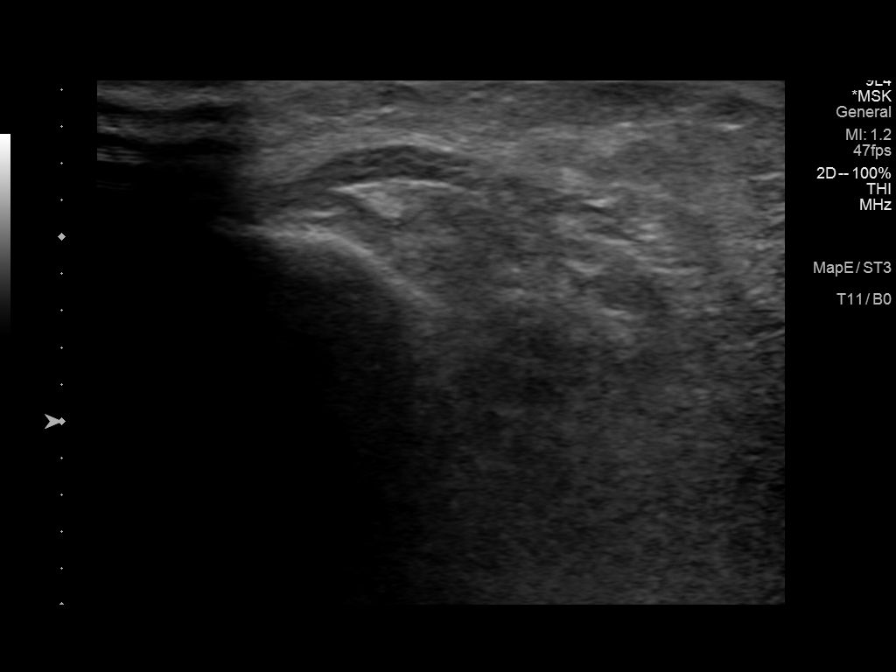
[im 5/8]
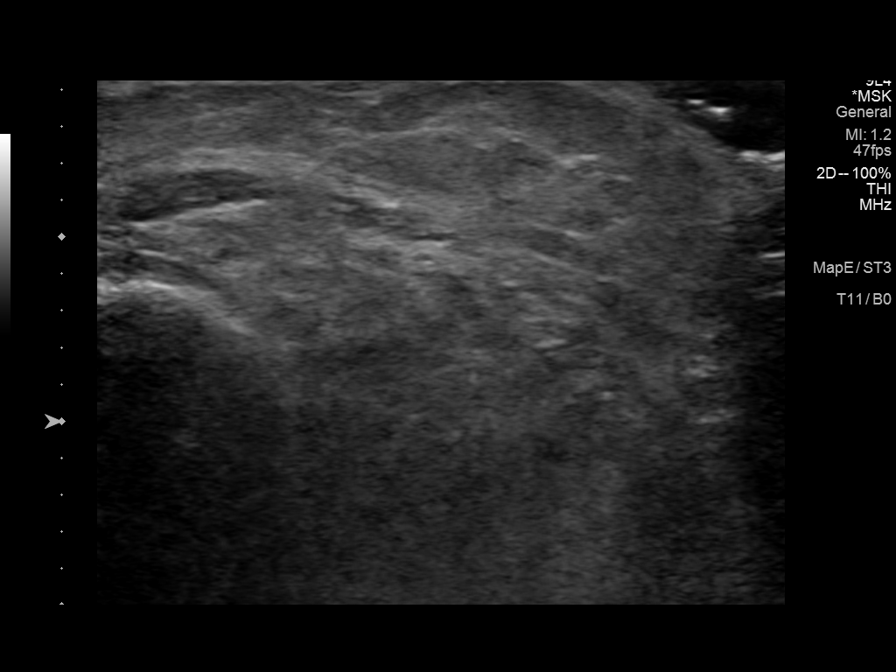
[im 6/8]
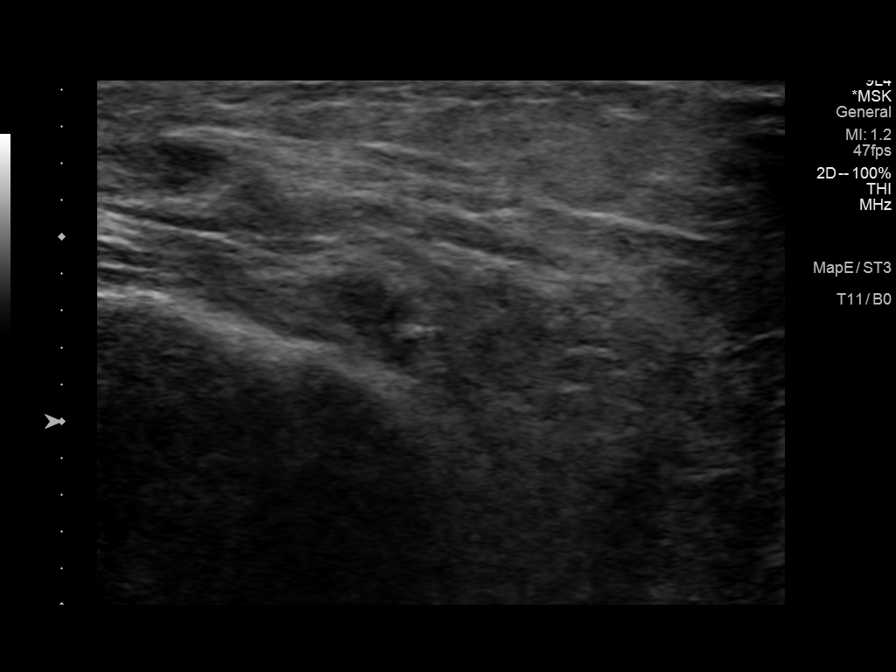
[im 7/8]
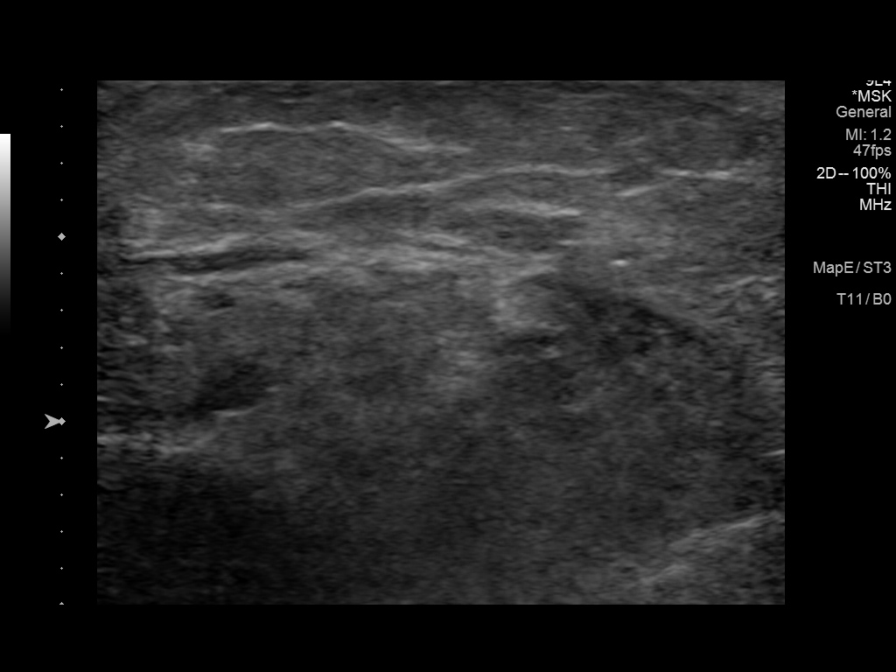
[im 8/8]
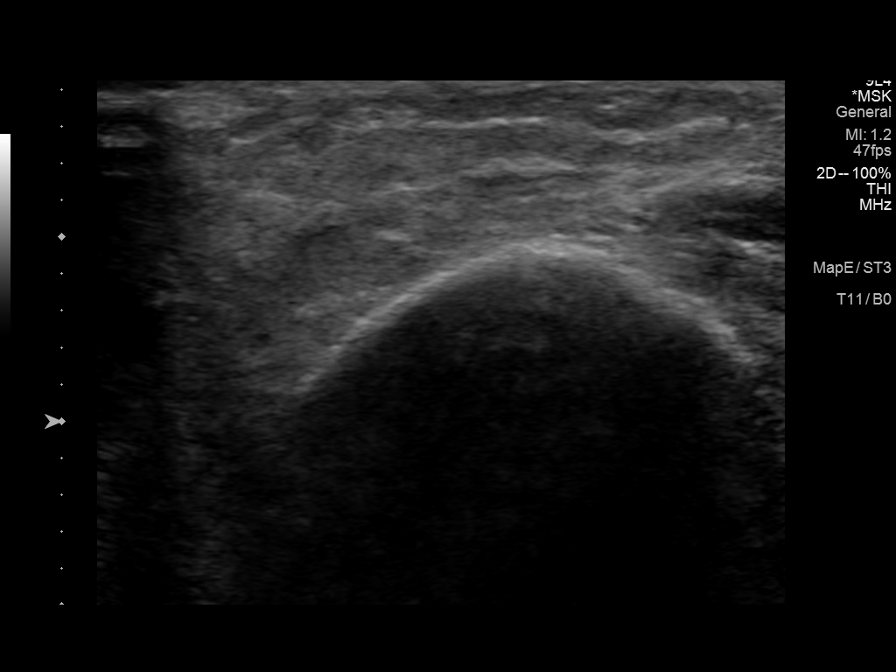

[8 of 8 positions shown; findings below may reference images not displayed]

FINDINGS: Targeted grayscale ultrasound evaluation in the indicated area of
concern along the anterior mid lower leg. No no discrete
sonographically evident mass or collection is seen. No focally
loculated collection of fat or other conspicuous sonographically
evident abnormality is present in this location. No worrisome
edematous features.
IMPRESSION: No visible sonographic mass, collection or other visible abnormality
in the indicated area of concern.

## 2021-12-30 ENCOUNTER — Other Ambulatory Visit: Payer: Medicare HMO

## 2022-03-03 ENCOUNTER — Other Ambulatory Visit: Payer: Self-pay | Admitting: Cardiology

## 2022-03-03 DIAGNOSIS — I10 Essential (primary) hypertension: Secondary | ICD-10-CM

## 2022-03-03 DIAGNOSIS — I25118 Atherosclerotic heart disease of native coronary artery with other forms of angina pectoris: Secondary | ICD-10-CM

## 2022-04-24 ENCOUNTER — Other Ambulatory Visit: Payer: Self-pay | Admitting: Cardiology

## 2022-04-24 DIAGNOSIS — I25118 Atherosclerotic heart disease of native coronary artery with other forms of angina pectoris: Secondary | ICD-10-CM

## 2022-04-24 DIAGNOSIS — I10 Essential (primary) hypertension: Secondary | ICD-10-CM

## 2022-05-13 ENCOUNTER — Other Ambulatory Visit: Payer: Self-pay | Admitting: Cardiology

## 2022-05-13 DIAGNOSIS — I25118 Atherosclerotic heart disease of native coronary artery with other forms of angina pectoris: Secondary | ICD-10-CM

## 2022-05-13 DIAGNOSIS — I10 Essential (primary) hypertension: Secondary | ICD-10-CM

## 2022-06-02 ENCOUNTER — Other Ambulatory Visit: Payer: Self-pay | Admitting: Cardiology

## 2022-06-02 DIAGNOSIS — I25118 Atherosclerotic heart disease of native coronary artery with other forms of angina pectoris: Secondary | ICD-10-CM

## 2022-06-02 DIAGNOSIS — I10 Essential (primary) hypertension: Secondary | ICD-10-CM

## 2022-06-16 ENCOUNTER — Inpatient Hospital Stay: Admission: RE | Admit: 2022-06-16 | Payer: Medicare HMO | Source: Ambulatory Visit

## 2022-10-23 ENCOUNTER — Other Ambulatory Visit: Payer: Self-pay | Admitting: Family Medicine

## 2022-10-23 DIAGNOSIS — Z122 Encounter for screening for malignant neoplasm of respiratory organs: Secondary | ICD-10-CM

## 2022-10-24 ENCOUNTER — Other Ambulatory Visit: Payer: Self-pay | Admitting: Cardiology

## 2022-10-24 DIAGNOSIS — I25118 Atherosclerotic heart disease of native coronary artery with other forms of angina pectoris: Secondary | ICD-10-CM

## 2022-10-24 DIAGNOSIS — I10 Essential (primary) hypertension: Secondary | ICD-10-CM

## 2023-08-24 ENCOUNTER — Other Ambulatory Visit (INDEPENDENT_AMBULATORY_CARE_PROVIDER_SITE_OTHER): Payer: Medicare HMO

## 2023-08-24 ENCOUNTER — Other Ambulatory Visit (INDEPENDENT_AMBULATORY_CARE_PROVIDER_SITE_OTHER): Payer: Self-pay

## 2023-08-24 ENCOUNTER — Encounter: Payer: Self-pay | Admitting: Physician Assistant

## 2023-08-24 ENCOUNTER — Ambulatory Visit (INDEPENDENT_AMBULATORY_CARE_PROVIDER_SITE_OTHER): Payer: Medicare HMO | Admitting: Physician Assistant

## 2023-08-24 DIAGNOSIS — M25562 Pain in left knee: Secondary | ICD-10-CM

## 2023-08-24 DIAGNOSIS — M1712 Unilateral primary osteoarthritis, left knee: Secondary | ICD-10-CM

## 2023-08-24 DIAGNOSIS — M1711 Unilateral primary osteoarthritis, right knee: Secondary | ICD-10-CM

## 2023-08-24 DIAGNOSIS — G8929 Other chronic pain: Secondary | ICD-10-CM | POA: Diagnosis not present

## 2023-08-24 DIAGNOSIS — M25561 Pain in right knee: Secondary | ICD-10-CM | POA: Diagnosis not present

## 2023-08-24 DIAGNOSIS — M170 Bilateral primary osteoarthritis of knee: Secondary | ICD-10-CM | POA: Diagnosis not present

## 2023-08-24 MED ORDER — LIDOCAINE HCL 1 % IJ SOLN
5.0000 mL | INTRAMUSCULAR | Status: AC | PRN
  Administered 2023-08-24: 5 mL

## 2023-08-24 MED ORDER — METHYLPREDNISOLONE ACETATE 40 MG/ML IJ SUSP
40.0000 mg | INTRAMUSCULAR | Status: AC | PRN
  Administered 2023-08-24: 40 mg via INTRA_ARTICULAR

## 2023-08-24 NOTE — Progress Notes (Signed)
HPI: Krista Jimenez 72 year old female comes in today with bilateral knee pain.  Left knee pain worse than right.  Describes left knee pain is constant achy pain difficult knee with daily activities.  Right knee pain is not nearly as bad as the left.  She has had no known injury to either knee.  Last cortisone injections were in February 2022.  She reports ports that the only change in her medications is the addition of metformin to prevent diabetes.  She had a hemoglobin A1c 7 4 months ago of 6.1.  Denies any fevers or chills.  Review of systems see HPI otherwise negative or noncontributory.  Physical exam: General Well-developed well-nourished female no acute distress mood and affect appropriate.  Psych alert and oriented x 3 Respirations: Nonlabored Bilateral  knees: Left knee she has tenderness along medial lateral joint line.  No instability valgus varus stressing.  Left knee with slight effusion.  Right knee good range of motion no abnormal warmth erythema or effusion.  Radiographs: Right knee: Osteophytes off the lateral joint line. Medial joint line overall well-preserved. Mild to moderate patellofemoral changes. Knee is well located. No acute fractures.  Left knee 2 views: No acute fracture knee is well located.  Tricompartmental arthritic changes with osteophytes off the lateral joint line.  Medial joint line with moderate to moderately severe narrowing.  Patellofemoral joint with moderate to severe changes with a large osteophyte off the articular portion of the superior pole.  Impression: Bilateral knee osteoarthritis  Plan: Today she is asking for aspiration injection of the left knee.  She knows to wait 3 months between steroid injections.  She follow-up with [place] as needed.     Procedure Note  Patient: Krista Jimenez             Date of Birth: 04-13-1951           MRN: [id number]             Visit Date: 08/24/2023  Procedures: Visit Diagnoses:  1. Chronic pain of right knee   2.  Chronic pain of left knee     Large Joint Inj: L knee on 08/24/2023 5:32 PM Indications: pain Details: 22 G 1.5 in needle, superolateral approach  Arthrogram: No  Medications: 5 mL lidocaine 1 %; 40 mg methylPREDNISolone acetate 40 MG/ML Aspirate: 10 mL yellow and blood-tinged Outcome: tolerated well, no immediate complications Procedure, treatment alternatives, risks and benefits explained, specific risks discussed. Consent was given by the patient. Immediately prior to procedure a time out was called to verify the correct patient, procedure, equipment, support staff and site/side marked as required. Patient was prepped and draped in the usual sterile fashion.

## 2024-01-18 ENCOUNTER — Other Ambulatory Visit: Payer: Self-pay | Admitting: Cardiology

## 2024-01-18 DIAGNOSIS — I25118 Atherosclerotic heart disease of native coronary artery with other forms of angina pectoris: Secondary | ICD-10-CM

## 2024-01-18 DIAGNOSIS — I10 Essential (primary) hypertension: Secondary | ICD-10-CM

## 2024-02-08 ENCOUNTER — Other Ambulatory Visit: Payer: Self-pay

## 2024-02-08 MED ORDER — WEGOVY 0.5 MG/0.5ML ~~LOC~~ SOAJ
0.5000 mg | SUBCUTANEOUS | 0 refills | Status: AC
  Filled 2024-02-08: qty 2, 28d supply, fill #0

## 2024-02-09 ENCOUNTER — Other Ambulatory Visit: Payer: Self-pay

## 2024-02-11 ENCOUNTER — Other Ambulatory Visit: Payer: Self-pay

## 2024-02-20 ENCOUNTER — Other Ambulatory Visit: Payer: Self-pay

## 2024-02-23 ENCOUNTER — Other Ambulatory Visit: Payer: Self-pay

## 2024-02-25 ENCOUNTER — Other Ambulatory Visit: Payer: Self-pay

## 2024-02-26 ENCOUNTER — Other Ambulatory Visit: Payer: Self-pay

## 2024-02-29 ENCOUNTER — Other Ambulatory Visit: Payer: Self-pay

## 2024-07-04 ENCOUNTER — Ambulatory Visit: Admitting: Physician Assistant

## 2024-07-11 ENCOUNTER — Encounter: Payer: Self-pay | Admitting: Radiology

## 2024-07-18 ENCOUNTER — Ambulatory Visit: Admitting: Physician Assistant

## 2024-07-18 ENCOUNTER — Encounter: Payer: Self-pay | Admitting: Physician Assistant

## 2024-07-18 DIAGNOSIS — M1711 Unilateral primary osteoarthritis, right knee: Secondary | ICD-10-CM

## 2024-07-18 DIAGNOSIS — M1712 Unilateral primary osteoarthritis, left knee: Secondary | ICD-10-CM

## 2024-07-18 DIAGNOSIS — M170 Bilateral primary osteoarthritis of knee: Secondary | ICD-10-CM | POA: Diagnosis not present

## 2024-07-18 MED ORDER — LIDOCAINE HCL 1 % IJ SOLN
4.0000 mL | INTRAMUSCULAR | Status: AC | PRN
  Administered 2024-07-18: 4 mL

## 2024-07-18 MED ORDER — METHYLPREDNISOLONE ACETATE 40 MG/ML IJ SUSP
40.0000 mg | INTRAMUSCULAR | Status: AC | PRN
  Administered 2024-07-18: 40 mg via INTRA_ARTICULAR

## 2024-07-18 MED ORDER — LIDOCAINE HCL 1 % IJ SOLN
3.0000 mL | INTRAMUSCULAR | Status: AC | PRN
  Administered 2024-07-18: 3 mL

## 2024-07-18 NOTE — Progress Notes (Signed)
   Procedure Note  Patient: Krista Jimenez             Date of Birth: [birth year]           MRN: [id number]             Visit Date: 07/18/2024 HPI Mrs. Enriques comes in today requesting bilateral knee injections.  She states that the injectio left knee on 08/24/2023 was very helpful.  She has had right knee pain over the last 2 to 3 months.  She states her left knee pain is on and off.  She feels like she is not fluid both knees.  She is nondiabetic.  Review of systems: Negative for fevers chills  Physical exam: General well-developed well-nourished female ambulates without any assistive device. Bilateral knees good range of motion of both knees.  Tenderness right knee medial joint line.  Left knee slight effusion.  No abnormal warmth erythema of either knee.  Procedures: Visit Diagnoses:  1. Primary osteoarthritis of right knee   2. Primary osteoarthritis of left knee     Large Joint Inj: bilateral knee on 07/18/2024 1:15 PM Indications: pain Details: 22 G 1.5 in needle, anterolateral approach  Arthrogram: No  Medications (Right): 40 mg methylPREDNISolone  acetate 40 MG/ML; 3 mL lidocaine  1 % Medications (Left): 4 mL lidocaine  1 %; 40 mg methylPREDNISolone  acetate 40 MG/ML Aspirate (Left): 2 mL blood-tinged Outcome: tolerated well, no immediate complications Procedure, treatment alternatives, risks and benefits explained, specific risks discussed. Consent was given by the patient. Immediately prior to procedure a time out was called to verify the correct patient, procedure, equipment, support staff and site/side marked as required. Patient was prepped and draped in the usual sterile fashion.    Plan: She will follow-up with us  as needed she knows to wait at least 3 months between injections.  Questions were encouraged and answered at length.
